# Patient Record
Sex: Male | Born: 1970 | Race: White | Hispanic: No | Marital: Married | Smoking: Current every day smoker
Health system: Southern US, Community
[De-identification: ages and names within clinical notes are randomized; demographics above are authoritative.]

## PROBLEM LIST (undated history)

## (undated) DIAGNOSIS — J45909 Unspecified asthma, uncomplicated: Secondary | ICD-10-CM

## (undated) DIAGNOSIS — I2510 Atherosclerotic heart disease of native coronary artery without angina pectoris: Secondary | ICD-10-CM

## (undated) DIAGNOSIS — E785 Hyperlipidemia, unspecified: Secondary | ICD-10-CM

## (undated) DIAGNOSIS — K219 Gastro-esophageal reflux disease without esophagitis: Secondary | ICD-10-CM

## (undated) DIAGNOSIS — I499 Cardiac arrhythmia, unspecified: Secondary | ICD-10-CM

## (undated) DIAGNOSIS — I10 Essential (primary) hypertension: Secondary | ICD-10-CM

## (undated) HISTORY — DX: Gastro-esophageal reflux disease without esophagitis: K21.9

## (undated) HISTORY — DX: Hyperlipidemia, unspecified: E78.5

## (undated) HISTORY — DX: Unspecified asthma, uncomplicated: J45.909

## (undated) HISTORY — PX: OTHER SURGICAL HISTORY: SHX169

## (undated) HISTORY — DX: Essential (primary) hypertension: I10

---

## 1997-12-21 ENCOUNTER — Emergency Department (HOSPITAL_COMMUNITY): Admission: EM | Admit: 1997-12-21 | Discharge: 1997-12-21 | Payer: Self-pay | Admitting: *Deleted

## 1997-12-21 ENCOUNTER — Encounter: Payer: Self-pay | Admitting: Emergency Medicine

## 1998-01-08 ENCOUNTER — Ambulatory Visit (HOSPITAL_COMMUNITY): Admission: RE | Admit: 1998-01-08 | Discharge: 1998-01-08 | Payer: Self-pay | Admitting: *Deleted

## 2004-04-13 ENCOUNTER — Emergency Department (HOSPITAL_COMMUNITY): Admission: EM | Admit: 2004-04-13 | Discharge: 2004-04-13 | Payer: Self-pay | Admitting: Emergency Medicine

## 2005-05-23 ENCOUNTER — Emergency Department (HOSPITAL_COMMUNITY): Admission: EM | Admit: 2005-05-23 | Discharge: 2005-05-23 | Payer: Self-pay | Admitting: Emergency Medicine

## 2006-06-12 ENCOUNTER — Emergency Department (HOSPITAL_COMMUNITY): Admission: EM | Admit: 2006-06-12 | Discharge: 2006-06-12 | Payer: Self-pay | Admitting: Emergency Medicine

## 2007-07-06 ENCOUNTER — Ambulatory Visit: Payer: Self-pay | Admitting: Neurological Surgery

## 2007-09-28 ENCOUNTER — Encounter
Admission: RE | Admit: 2007-09-28 | Discharge: 2007-09-28 | Payer: Self-pay | Admitting: Physical Medicine and Rehabilitation

## 2007-09-30 ENCOUNTER — Encounter
Admission: RE | Admit: 2007-09-30 | Discharge: 2007-09-30 | Payer: Self-pay | Admitting: Physical Medicine and Rehabilitation

## 2007-11-23 ENCOUNTER — Encounter: Admission: RE | Admit: 2007-11-23 | Discharge: 2007-11-23 | Payer: Self-pay | Admitting: Orthopaedic Surgery

## 2007-11-29 ENCOUNTER — Ambulatory Visit (HOSPITAL_BASED_OUTPATIENT_CLINIC_OR_DEPARTMENT_OTHER): Admission: RE | Admit: 2007-11-29 | Discharge: 2007-11-29 | Payer: Self-pay | Admitting: Orthopaedic Surgery

## 2007-12-15 ENCOUNTER — Encounter
Admission: RE | Admit: 2007-12-15 | Discharge: 2007-12-15 | Payer: Self-pay | Admitting: Physical Medicine and Rehabilitation

## 2010-08-19 NOTE — Op Note (Signed)
Jesse Nelson, Jesse Nelson              ACCOUNT NO.:  [id number]   MEDICAL RECORD NO.:  [id number]          PATIENT TYPE:  AMB   LOCATION:  DSC                          FACILITY:  [facility]   PHYSICIAN:  Lubertha Basque. Dalldorf, M.D.DATE OF BIRTH:  [birth year]   DATE OF PROCEDURE:  11/29/2007  DATE OF DISCHARGE:                               OPERATIVE REPORT   PREOPERATIVE DIAGNOSES:  1. Left shoulder impingement.  2. Left shoulder acromioclavicular pain.   POSTOPERATIVE DIAGNOSES:  1. Left shoulder impingement.  2. Left shoulder acromioclavicular pain.   PROCEDURES:  1. Left shoulder arthroscopic acromioplasty.  2. Left shoulder arthroscopic debridement.  3. Left shoulder open AC resection.   ANESTHESIA:  General and block.   ATTENDING SURGEON:  Lubertha Basque. Jerl Santos, MD   ASSISTANT:  Lindwood Qua, PA   INDICATIONS FOR PROCEDURE:  The patient is a 40 year old male with many  months of left shoulder pain.  This persisted despite oral anti-  inflammatories and rest and an injection.  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  He is offered an arthroscopy with continued pain which  limits his ability to rest and use his arm.  He has also been offered an  open [facility] resection as this looked like too much to remove via the scope.  Informed operative consent was obtained after discussion of possible  complications of reaction to anesthesia and infection.   SUMMARY/FINDINGS/PROCEDURE:  Under general anesthesia and a block, the  left shoulder procedure was performed.  Glenohumeral joint showed no  degenerative changes and the biceps tendon looked normal.  Rotator cuff  appeared benign from below.  In the subacromial space, he had some  irritation of his rotator cuff related to a large subacromial spur.  Acromioplasty was done with a bur followed by debridement of partial-  thickness rotator cuff tearing.  We then made an  incision on superior  aspect of the shoulder and excised the distal clavicular nonunion which  consisted of about half-an-inch or three-quarters of an inch of bone.  I  used fluoroscopy during this portion of the case and read these views  myself.  Bryna Colander assisted throughout and was invaluable to the  completion of the case in that he helped position and retract while I  performed the procedure.   DESCRIPTION OF PROCEDURE:  The patient was brought to the operating  suite where general anesthetic was applied without difficulty.  He was  also given a block in the preanesthesia area.  He was positioned in a  beach-chair position and prepped and draped in normal sterile fashion.  After administration of IV Kefzol, an arthroscopy of the left shoulder  was performed through total of 2 portals.  Findings were as noted above  and procedure consisted of acromioplasty done with a bur in the lateral  position followed by transfer of bur to posterior position.  I then  performed the debridement of bursal aspect of the cuff with a shaver.  No full-thickness or significant partial-thickness tear requiring repair  was  seen.  We then made a small incision over the [facility] joint with  dissection down to soft tissues.  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  I removed  this 3/4-inch segment of bone sharply with a knife.  I then used a saw  to freshen the end.  I used fluoroscopy to confirm adequate resection of  distal clavicle.  The wound was irrigated followed by reapproximation of  the soft tissues over the distal clavicle with 0 Vicryl suture.  We then  reapproximated subcutaneous tissues with 2-0 undyed Vicryl and skin was  closed with nylon.  We injected some Marcaine with epinephrine to the  shoulder and at the incision site.  Adaptic was applied to all incisions  and portals followed by dry gauze and tape.  Estimated blood loss and   intraoperative fluids can be obtained from anesthesia records.   DISPOSITION:  The patient was extubated in the operating room and taken  to recovery room in stable condition.  He is to go home the same day and  follow up in the office next week.  I will contact him by phone tonight.      Lubertha Basque Jerl Santos, M.D.  Electronically Signed     PGD/MEDQ  D:  11/29/2007  T:  11/30/2007  Job:  [id number]

## 2016-10-06 ENCOUNTER — Other Ambulatory Visit: Payer: Self-pay | Admitting: Orthopaedic Surgery

## 2016-10-06 DIAGNOSIS — M5412 Radiculopathy, cervical region: Secondary | ICD-10-CM

## 2016-10-20 ENCOUNTER — Ambulatory Visit
Admission: RE | Admit: 2016-10-20 | Discharge: 2016-10-20 | Disposition: A | Discharge status: Home / Self Care | Payer: Medicare Other | Source: Ambulatory Visit | Attending: Orthopaedic Surgery | Admitting: Orthopaedic Surgery

## 2016-10-20 DIAGNOSIS — M5412 Radiculopathy, cervical region: Secondary | ICD-10-CM

## 2016-10-20 MED ORDER — DIAZEPAM 5 MG PO TABS
5.0000 mg | ORAL_TABLET | Freq: Once | ORAL | Status: AC
  Administered 2016-10-20: 5 mg via ORAL

## 2016-10-20 MED ORDER — ONDANSETRON HCL 4 MG/2ML IJ SOLN
4.0000 mg | Freq: Four times a day (QID) | INTRAMUSCULAR | Status: DC | PRN
Start: 2016-10-20 — End: 2016-10-21

## 2016-10-20 MED ORDER — IOPAMIDOL (ISOVUE-M 300) INJECTION 61%
10.0000 mL | Freq: Once | INTRAMUSCULAR | Status: AC | PRN
  Administered 2016-10-20: 10 mL via INTRATHECAL

## 2016-10-20 NOTE — Discharge Instructions (Signed)

## 2017-07-20 IMAGING — CT CT CERVICAL SPINE W/ CM
2 series · 10 of 14 positions shown, 12 images · non-contrast
Comparison: Cervical spine MRI 07/06/2007

CLINICAL DATA: Cervical radiculopathy. Neck and left upper
extremity pain.
TECHNIQUE: Contiguous axial images were obtained through the Cervical spine
after the intrathecal infusion of infusion. Coronal and sagittal
reconstructions were obtained of the axial image sets.

[Series 2: cspine soft · axial · 0.23mm/px · z∈[-200,-70]mm · 5 of 99 slices shown]
[im 17/99  soft-tissue]
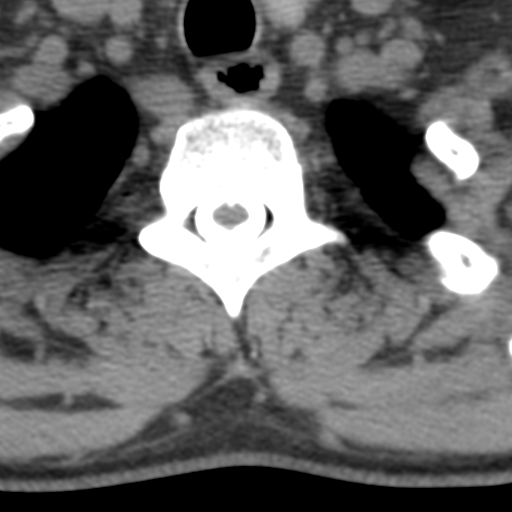
[im 33/99  soft-tissue]
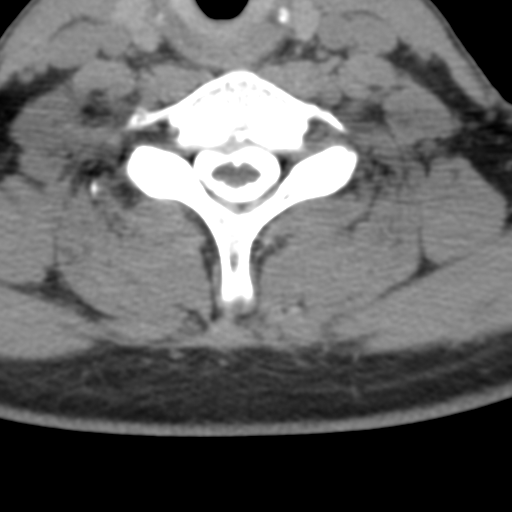
[im 50/99  soft-tissue]
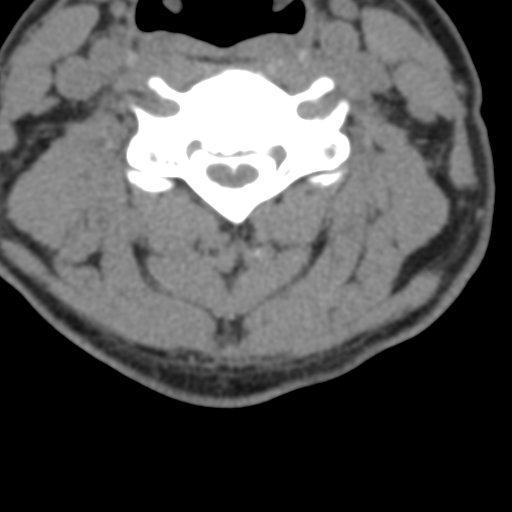
[im 66/99  soft-tissue]
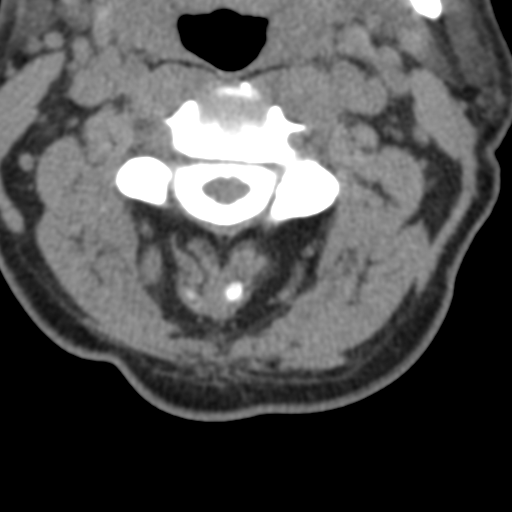
[im 82/99  soft-tissue]
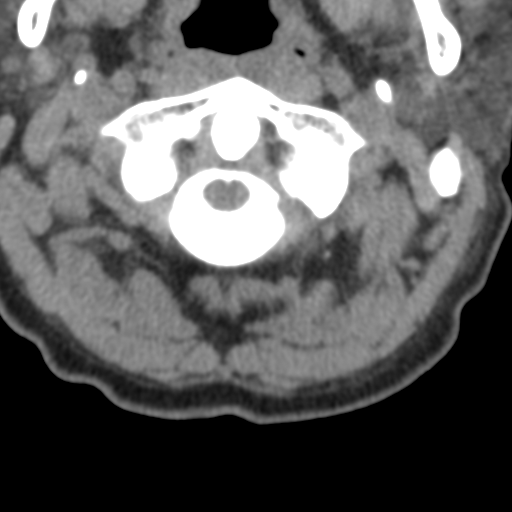

[Series 8: angled axial · axial · 0.23mm/px · z∈[-201,-73]mm · 5 of 98 slices shown, 7 images]
[im 17/98  soft-tissue]
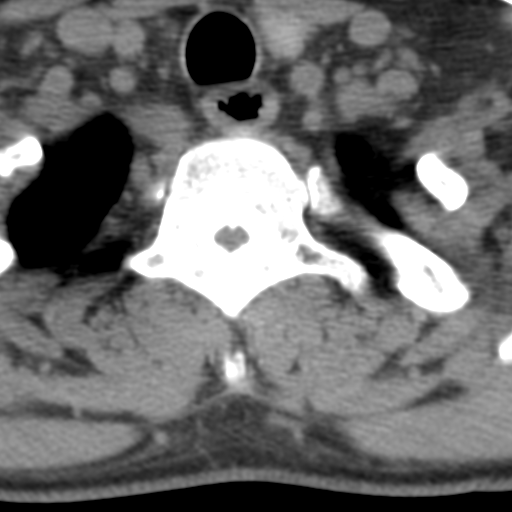
[im 17/98  bone]
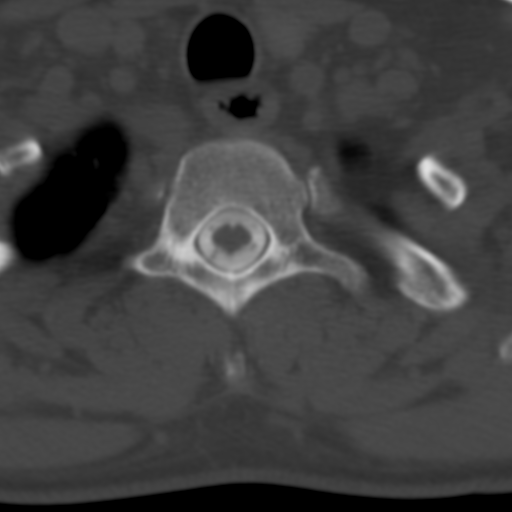
[im 33/98  bone]
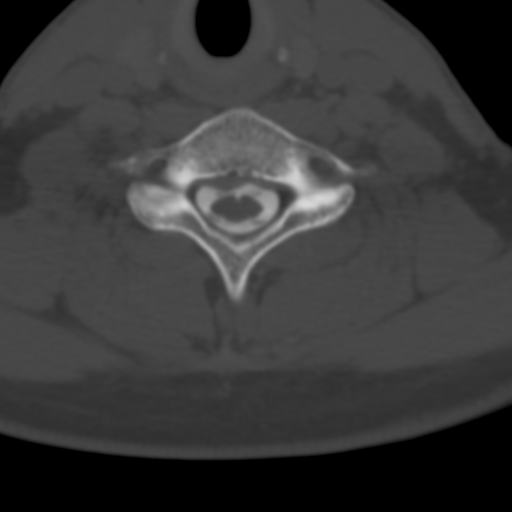
[im 49/98  bone]
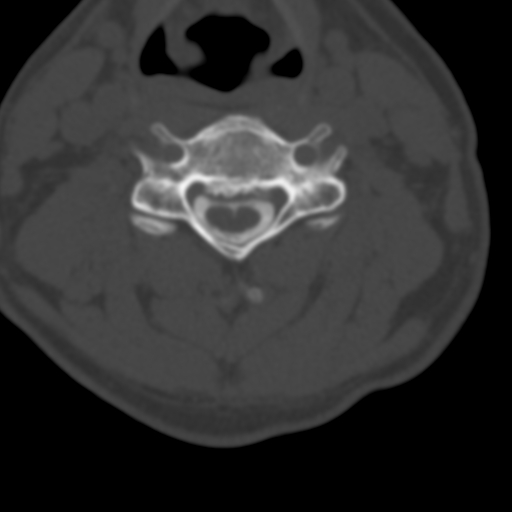
[im 65/98  bone]
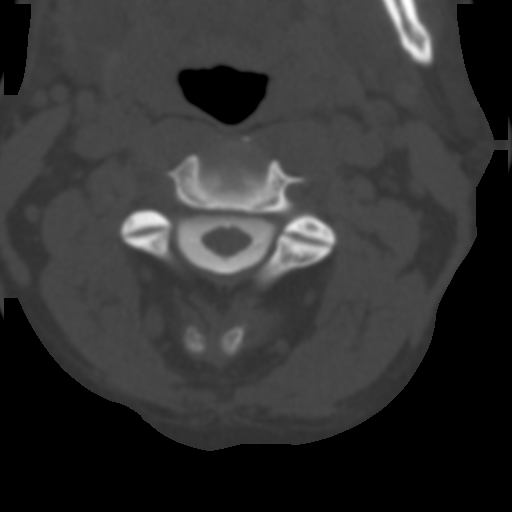
[im 81/98  soft-tissue]
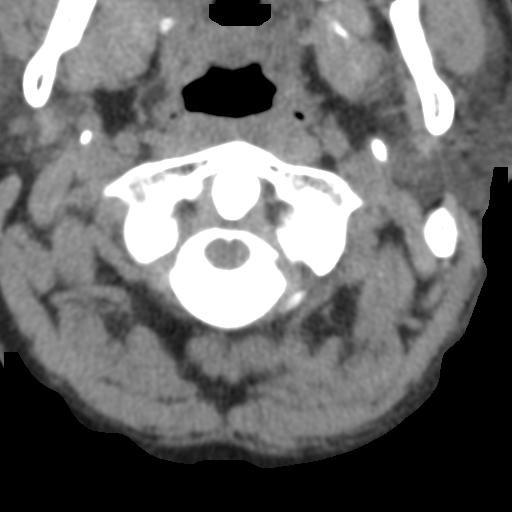
[im 81/98  bone]
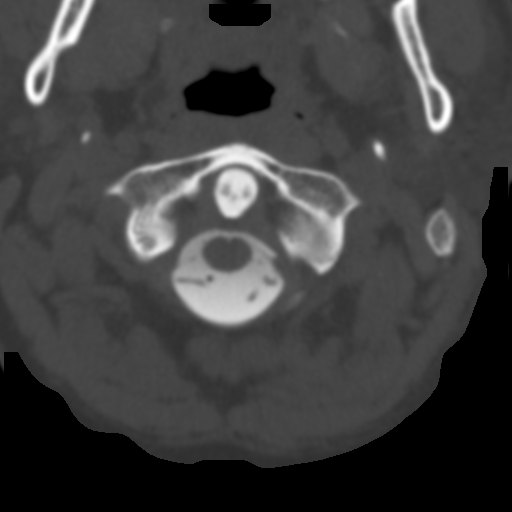

[10 of 14 positions shown; findings below may reference images not displayed]

FLUOROSCOPY TIME:  Radiation Exposure Index (as provided by the
fluoroscopic device): 40.01 microGray*m^2

Fluoroscopy Time (in minutes and seconds):  19 seconds

PROCEDURE:
LUMBAR PUNCTURE FOR CERVICAL MYELOGRAM

After thorough discussion of risks and benefits of the procedure
including bleeding, infection, injury to nerves, blood vessels,
adjacent structures as well as headache and CSF leak, written and
oral informed consent was obtained. Consent was obtained by Dr.
Mevalleb Serbellon. We discussed the high likelihood of obtaining a
diagnostic study.

Patient was positioned prone on the fluoroscopy table. Local
anesthesia was provided with 1% lidocaine without epinephrine after
prepped and draped in the usual sterile fashion. Puncture was
performed at L3-4 using a 3 1/2 inch 22-gauge spinal needle via a
right interlaminar approach. Using a single pass through the dura,
the needle was placed within the thecal sac, with return of clear
CSF. 10 mL of Isovue-M 300 was injected into the thecal sac, with
normal opacification of the nerve roots and cauda equina consistent
with free flow within the subarachnoid space. The patient was then
moved to the trendelenburg position and contrast flowed into the
Cervical spine region.

I personally performed the lumbar puncture and administered the
intrathecal contrast. I also personally supervised acquisition of
the myelogram images.
FINDINGS: CERVICAL MYELOGRAM FINDINGS:

There is grade 1 retrolisthesis of C3 on C4 and C4 on C5. Disc
degeneration is present from C3-4 to C6-7 with effacement of the
nerve root sleeves bilaterally at these levels, most notable on the
left. Ventral extradural defects are present at each of these levels
though the cervical spinal canal appears relatively capacious in
general and there is only likely at most mild spinal stenosis.

CT CERVICAL MYELOGRAM FINDINGS:

Vertebral alignment is unchanged from the prior MRI, with slight
retrolisthesis again seen of C3 on C4 and C4 on C5 and with
straightening of the normal lordosis in the upper cervical spine. No
fracture or destructive osseous process is identified. Cervical
spinal cord morphology is normal. The paraspinal soft tissues are
unremarkable.

C2-3:  Negative.

C3-4: Mild disc space narrowing. Disc bulging and right greater than
left uncovertebral spurring result in moderate right and
mild-to-moderate left neural foraminal stenosis, not significantly
changed. There is partial effacement of the ventral thecal sac
without significant overall spinal stenosis or spinal cord mass
effect.

C4-5: Moderate disc space narrowing. Broad-based posterior disc
osteophyte complex results in mild spinal stenosis and
mild-to-moderate right and moderate to severe left neural foraminal
stenosis, not significantly changed. Potential left C5 nerve root
impingement.

C5-6: Disc bulging, uncovertebral spurring, and a left paracentral/
posterolateral disc osteophyte complex result in mild right and
moderate to severe left neural foraminal stenosis and mild spinal
stenosis, not significantly changed. Potential left C6 nerve root
impingement.

C6-7: Slight disc space narrowing. Increased disc bulging results in
new mild spinal stenosis and mild left neural foraminal stenosis.
There is a new right paracentral disc extrusion extending along the
length of the C7 vertebral body, favored to be arising from the C6-7
disc rather than C7-T1 and contributing to the mild spinal stenosis.

C7-T1: Right paracentral disc herniation as described above. No
significant spinal or neural foraminal stenosis.
IMPRESSION: 1. Mild spinal stenosis and moderate to severe left neural foraminal
stenosis at C4-5 and C5-6, not significantly changed. Potential left
C5 and C6 nerve root impingement.
2. Progressive disc degeneration at C6-7 with mild spinal and mild
left neural foraminal stenosis.
3. Unchanged right greater than left foraminal stenosis at C3-4.

## 2020-03-14 ENCOUNTER — Ambulatory Visit (INDEPENDENT_AMBULATORY_CARE_PROVIDER_SITE_OTHER): Payer: Medicare Other | Admitting: Urology

## 2020-03-14 ENCOUNTER — Other Ambulatory Visit: Payer: Self-pay

## 2020-03-14 ENCOUNTER — Encounter: Payer: Self-pay | Admitting: Urology

## 2020-03-14 VITALS — BP 134/72 | HR 96 | Ht 68.0 in | Wt 150.0 lb

## 2020-03-14 DIAGNOSIS — N489 Disorder of penis, unspecified: Secondary | ICD-10-CM

## 2020-03-14 NOTE — Progress Notes (Signed)
   03/14/2020 10:18 AM   Jesse Nelson 1970/07/20 [id number]  Referring provider: Sherron Monday, MD [address] [place],  [place] [address]  Chief Complaint  Patient presents with  . Other    HPI: Jesse Nelson is a 49 y.o. male seen at the request of Dr.Tejan-Sie for evaluation of a penile wart.   Present 3-4 years  States initially treated with a topical application without success  No bothersome LUTS   PMH: Past Medical History:  Diagnosis Date  . Asthma   . GERD (gastroesophageal reflux disease)   . Hyperlipidemia   . Hypertension     Surgical History: History reviewed. No pertinent surgical history.  Home Medications:  Allergies as of 03/14/2020   No Known Allergies     Medication List       Accurate as of March 14, 2020 10:18 AM. If you have any questions, ask your nurse or doctor.        albuterol 108 (90 Base) MCG/ACT inhaler Commonly known as: VENTOLIN HFA Inhale into the lungs every 6 (six) hours as needed for wheezing or shortness of breath.   fluticasone-salmeterol 115-21 MCG/ACT inhaler Commonly known as: ADVAIR HFA Inhale 2 puffs into the lungs 2 (two) times daily.   lisinopril 10 MG tablet Commonly known as: ZESTRIL Take 10 mg by mouth daily.   omeprazole 40 MG capsule Commonly known as: PRILOSEC Take 40 mg by mouth daily.   oxyCODONE-acetaminophen 10-325 MG tablet Commonly known as: PERCOCET Take 1 tablet by mouth every 4 (four) hours as needed for pain.       Allergies: No Known Allergies  Family History: History reviewed. No pertinent family history.  Social History:  reports that he has been smoking cigarettes. He has a 26.00 pack-year smoking history. He has never used smokeless tobacco. No history on file for alcohol use and drug use.   Physical Exam: BP 134/72   Pulse 96   Ht 5\' 8"  (1.727 m)   Wt 150 lb (68 kg)   BMI 22.81 kg/m   Constitutional:  Alert and oriented, No acute distress. HEENT: [place] AT,  moist mucus membranes.  Trachea midline, no masses. Cardiovascular: No clubbing, cyanosis, or edema. Respiratory: Normal respiratory effort, no increased work of breathing. GU: Phallus circumcised; on ventral shaft distally is a 5 mm lesion on a small stalk Skin: No rashes, bruises or suspicious lesions. Neurologic: Grossly intact, no focal deficits, moving all 4 extremities. Psychiatric: Normal mood and affect.   Assessment & Plan:    1.  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

## 2020-03-22 ENCOUNTER — Other Ambulatory Visit: Payer: Self-pay | Admitting: Urology

## 2020-03-22 ENCOUNTER — Ambulatory Visit (INDEPENDENT_AMBULATORY_CARE_PROVIDER_SITE_OTHER): Payer: Medicare Other | Admitting: Urology

## 2020-03-22 ENCOUNTER — Encounter: Payer: Self-pay | Admitting: Urology

## 2020-03-22 ENCOUNTER — Other Ambulatory Visit: Payer: Self-pay

## 2020-03-22 VITALS — BP 122/73 | HR 91 | Ht 68.0 in | Wt 150.0 lb

## 2020-03-22 DIAGNOSIS — N489 Disorder of penis, unspecified: Secondary | ICD-10-CM | POA: Diagnosis not present

## 2020-03-22 NOTE — Progress Notes (Signed)
[id number]  

## 2020-03-23 ENCOUNTER — Encounter: Payer: Self-pay | Admitting: Urology

## 2020-03-23 NOTE — Progress Notes (Signed)
Procedure note  Diagnosis: Penile skin lesion Procedure: Full-thickness excision penile skin lesion Anesthetic: 1% plain Xylocaine Indications: 49 y.o. male with a penile skin lesion who desires excision Complications: None  Description: External genitalia were prepped and draped. Timeout was performed. The skin around the pedunculated 0.7 cm lesion located on the ventral penile shaft distally was anesthetized with 1% plain Xylocaine.  The lesion had a small stalk and was excised with a small ellipse. Hemostasis was obtained with cautery. The skin defect was closed with interrupted 3-0 chromic suture x2. The site was dressed with a loose gauze wrap.  Plan:  Lesion sent for pathologic analysis  Abstain from intercourse x5-7 days; use condom if sutures have not dissolved  May shower in 24 hours, no bath, hot tub or swimming for 7 days  Call for redness, drainage, swelling or fever >101 degrees

## 2020-03-26 LAB — SURGICAL PATHOLOGY

## 2020-03-27 ENCOUNTER — Telehealth: Payer: Self-pay | Admitting: *Deleted

## 2020-03-27 NOTE — Telephone Encounter (Signed)
Notified patient as instructed, patient pleased °

## 2020-03-27 NOTE — Telephone Encounter (Signed)
-----   Message from Riki Altes, MD sent at 03/27/2020  7:45 AM EST ----- Pathology report on excised skin lesion was a venereal wart.  No further treatment is needed

## 2022-05-19 ENCOUNTER — Other Ambulatory Visit: Payer: Medicare Other

## 2022-05-19 ENCOUNTER — Other Ambulatory Visit: Payer: Self-pay | Admitting: Internal Medicine

## 2022-05-19 ENCOUNTER — Ambulatory Visit: Payer: Medicare Other | Admitting: Internal Medicine

## 2022-05-20 LAB — COMPREHENSIVE METABOLIC PANEL
ALT: 13 IU/L (ref 0–44)
AST: 31 IU/L (ref 0–40)
Albumin/Globulin Ratio: 1.8 (ref 1.2–2.2)
Albumin: 4.5 g/dL (ref 3.8–4.9)
Alkaline Phosphatase: 69 IU/L (ref 44–121)
BUN/Creatinine Ratio: 12 (ref 9–20)
BUN: 14 mg/dL (ref 6–24)
Bilirubin Total: 0.3 mg/dL (ref 0.0–1.2)
CO2: 24 mmol/L (ref 20–29)
Calcium: 9.3 mg/dL (ref 8.7–10.2)
Chloride: 103 mmol/L (ref 96–106)
Creatinine, Ser: 1.15 mg/dL (ref 0.76–1.27)
Globulin, Total: 2.5 g/dL (ref 1.5–4.5)
Glucose: 104 mg/dL — ABNORMAL HIGH (ref 70–99)
Potassium: 4.3 mmol/L (ref 3.5–5.2)
Sodium: 142 mmol/L (ref 134–144)
Total Protein: 7 g/dL (ref 6.0–8.5)
eGFR: 77 mL/min/{1.73_m2} (ref >59)

## 2022-05-20 LAB — LIPID PANEL W/O CHOL/HDL RATIO
Cholesterol, Total: 157 mg/dL (ref 100–199)
HDL: 44 mg/dL (ref >39)
LDL Chol Calc (NIH): 65 mg/dL (ref 0–99)
Triglycerides: 301 mg/dL — ABNORMAL HIGH (ref 0–149)
VLDL Cholesterol Cal: 48 mg/dL — ABNORMAL HIGH (ref 5–40)

## 2022-05-20 LAB — HGB A1C W/O EAG: Hgb A1c MFr Bld: 5.9 % — ABNORMAL HIGH (ref 4.8–5.6)

## 2022-05-22 ENCOUNTER — Encounter: Payer: Self-pay | Admitting: Internal Medicine

## 2022-05-22 ENCOUNTER — Ambulatory Visit (INDEPENDENT_AMBULATORY_CARE_PROVIDER_SITE_OTHER): Payer: Medicare Other | Admitting: Internal Medicine

## 2022-05-22 VITALS — BP 122/80 | HR 98 | Ht 68.0 in | Wt 151.6 lb

## 2022-05-22 DIAGNOSIS — E782 Mixed hyperlipidemia: Secondary | ICD-10-CM | POA: Diagnosis not present

## 2022-05-22 DIAGNOSIS — K219 Gastro-esophageal reflux disease without esophagitis: Secondary | ICD-10-CM | POA: Diagnosis not present

## 2022-05-22 DIAGNOSIS — I10 Essential (primary) hypertension: Secondary | ICD-10-CM | POA: Diagnosis not present

## 2022-05-22 DIAGNOSIS — R7303 Prediabetes: Secondary | ICD-10-CM | POA: Diagnosis not present

## 2022-05-22 NOTE — Progress Notes (Signed)
Established Patient Office Visit  Subjective:  Patient ID: Jesse Nelson, male    DOB: [birth year]  Age: 52 y.o. MRN: [id number]  Chief Complaint  Patient presents with   Follow-up    3 month follow up    No new complaints, here for lab review and medication refills. Labs reviewed and notable for improvement in prediabetes, well controlled lipids but deterioration in triglycerides.    Past Medical History:  Diagnosis Date   Asthma    GERD (gastroesophageal reflux disease)    Hyperlipidemia    Hypertension     Social History   Socioeconomic History   Marital status: Married    Spouse name: Not on file   Number of children: Not on file   Years of education: Not on file   Highest education level: Not on file  Occupational History   Not on file  Tobacco Use   Smoking status: Every Day    Packs/day: 1.00    Years: 26.00    Total pack years: 26.00    Types: Cigarettes   Smokeless tobacco: Never  Substance and Sexual Activity   Alcohol use: Not on file   Drug use: Not on file   Sexual activity: Not on file  Other Topics Concern   Not on file  Social History Narrative   Not on file   Social Determinants of Health   Financial Resource Strain: Not on file  Food Insecurity: Not on file  Transportation Needs: Not on file  Physical Activity: Not on file  Stress: Not on file  Social Connections: Not on file  Intimate Partner Violence: Not on file    No family history on file.  No Known Allergies  Review of Systems  Constitutional: Negative.   HENT: Negative.    Eyes: Negative.   Respiratory: Negative.    Cardiovascular: Negative.   Gastrointestinal: Negative.   Genitourinary: Negative.   Musculoskeletal:        Chronic pain  Skin: Negative.   Neurological: Negative.   Endo/Heme/Allergies: Negative.   Psychiatric/Behavioral: Negative.         Objective:   BP 122/80   Pulse 98   Ht 5' 8"$  (1.727 m)   Wt 151 lb 9.6 oz (68.8 kg)   SpO2 97%    BMI 23.05 kg/m   Vitals:   05/22/22 1147  BP: 122/80  Pulse: 98  Height: 5' 8"$  (1.727 m)  Weight: 151 lb 9.6 oz (68.8 kg)  SpO2: 97%  BMI (Calculated): 23.06    Physical Exam Vitals reviewed.  Constitutional:      Appearance: Normal appearance.  HENT:     Head: Normocephalic.     Left Ear: There is no impacted cerumen.     Nose: Nose normal.     Mouth/Throat:     Mouth: Mucous membranes are moist.     Pharynx: No posterior oropharyngeal erythema.  Eyes:     Extraocular Movements: Extraocular movements intact.     Pupils: Pupils are equal, round, and reactive to light.  Cardiovascular:     Rate and Rhythm: Regular rhythm.     Chest Wall: PMI is not displaced.     Pulses: Normal pulses.     Heart sounds: Normal heart sounds. No murmur heard. Pulmonary:     Effort: Pulmonary effort is normal.     Breath sounds: Normal air entry. No rhonchi or rales.  Abdominal:     General: Abdomen is flat. Bowel sounds are normal. There is  no distension.     Palpations: Abdomen is soft. There is no hepatomegaly, splenomegaly or mass.     Tenderness: There is no abdominal tenderness.  Musculoskeletal:        General: Normal range of motion.     Cervical back: Normal range of motion and neck supple.     Right lower leg: No edema.     Left lower leg: No edema.  Skin:    General: Skin is warm and dry.  Neurological:     General: No focal deficit present.     Mental Status: He is alert and oriented to person, place, and time.     Cranial Nerves: No cranial nerve deficit.     Motor: No weakness.  Psychiatric:        Mood and Affect: Mood normal.        Behavior: Behavior normal.      No results found for any visits on 05/22/22.  Recent Results (from the past 2160 hour(Angla Delahunt))  Comprehensive metabolic panel     Status: Abnormal   Collection Time: 05/19/22  9:48 AM  Result Value Ref Range   Glucose 104 (H) 70 - 99 mg/dL   BUN 14 6 - 24 mg/dL   Creatinine, Ser 1.15 0.76 - 1.27 mg/dL    eGFR 77 >59 mL/min/1.73   BUN/Creatinine Ratio 12 9 - 20   Sodium 142 134 - 144 mmol/L   Potassium 4.3 3.5 - 5.2 mmol/L   Chloride 103 96 - 106 mmol/L   CO2 24 20 - 29 mmol/L   Calcium 9.3 8.7 - 10.2 mg/dL   Total Protein 7.0 6.0 - 8.5 g/dL   Albumin 4.5 3.8 - 4.9 g/dL   Globulin, Total 2.5 1.5 - 4.5 g/dL   Albumin/Globulin Ratio 1.8 1.2 - 2.2   Bilirubin Total 0.3 0.0 - 1.2 mg/dL   Alkaline Phosphatase 69 44 - 121 IU/L   AST 31 0 - 40 IU/L   ALT 13 0 - 44 IU/L  Lipid Panel w/o Chol/HDL Ratio     Status: Abnormal   Collection Time: 05/19/22  9:48 AM  Result Value Ref Range   Cholesterol, Total 157 100 - 199 mg/dL   Triglycerides 301 (H) 0 - 149 mg/dL   HDL 44 >39 mg/dL   VLDL Cholesterol Cal 48 (H) 5 - 40 mg/dL   LDL Chol Calc (NIH) 65 0 - 99 mg/dL  Hgb A1c w/o eAG     Status: Abnormal   Collection Time: 05/19/22  9:48 AM  Result Value Ref Range   Hgb A1c MFr Bld 5.9 (H) 4.8 - 5.6 %    Comment:          Prediabetes: 5.7 - 6.4          Diabetes: >6.4          Glycemic control for adults with diabetes: <7.0       Assessment & Plan:   Problem List Items Addressed This Visit   None 1. Mixed hyperlipidemia - Comprehensive metabolic panel; Future - Lipid panel; Future  2. Primary hypertension  3. Prediabetes  4. GERD without esophagitis    No follow-ups on file.   Total time spent: 20 minutes  Volanda Napoleon, MD  05/22/2022

## 2022-06-15 ENCOUNTER — Other Ambulatory Visit: Payer: Self-pay | Admitting: Internal Medicine

## 2022-07-08 ENCOUNTER — Other Ambulatory Visit: Payer: Self-pay | Admitting: Internal Medicine

## 2022-07-23 ENCOUNTER — Ambulatory Visit (INDEPENDENT_AMBULATORY_CARE_PROVIDER_SITE_OTHER): Payer: Medicare Other | Admitting: Cardiovascular Disease

## 2022-07-23 ENCOUNTER — Encounter: Payer: Self-pay | Admitting: Cardiovascular Disease

## 2022-07-23 VITALS — BP 138/80 | HR 116 | Ht 68.0 in | Wt 151.0 lb

## 2022-07-23 DIAGNOSIS — R0602 Shortness of breath: Secondary | ICD-10-CM | POA: Diagnosis not present

## 2022-07-23 DIAGNOSIS — E782 Mixed hyperlipidemia: Secondary | ICD-10-CM | POA: Diagnosis not present

## 2022-07-23 DIAGNOSIS — I340 Nonrheumatic mitral (valve) insufficiency: Secondary | ICD-10-CM | POA: Diagnosis not present

## 2022-07-23 MED ORDER — NITROGLYCERIN 0.4 MG SL SUBL: 0.4000 mg | SUBLINGUAL_TABLET | SUBLINGUAL | 3 refills | Status: AC | PRN

## 2022-07-23 MED ORDER — LISINOPRIL-HYDROCHLOROTHIAZIDE 20-12.5 MG PO TABS: 2.0000 | ORAL_TABLET | Freq: Every day | ORAL | 0 refills | Status: DC

## 2022-07-23 MED ORDER — AMLODIPINE BESYLATE 5 MG PO TABS: 5.0000 mg | ORAL_TABLET | Freq: Every morning | ORAL | 2 refills | Status: DC

## 2022-07-23 MED ORDER — METOPROLOL SUCCINATE ER 100 MG PO TB24: 100.0000 mg | ORAL_TABLET | Freq: Every day | ORAL | 2 refills | Status: DC

## 2022-07-23 NOTE — Progress Notes (Signed)
Cardiology Office Note   Date:  07/23/2022   ID:  Jesse Nelson, DOB 12-09-1970, MRN [id number]  PCP:  Sherron Monday, MD  Cardiologist:  Adrian Blackwater, MD      History of Present Illness: Jesse Nelson is a 52 y.o. male who presents for  Chief Complaint  Patient presents with   Follow-up    3 month follow up    Occasional SOB      Past Medical History:  Diagnosis Date   Asthma    GERD (gastroesophageal reflux disease)    Hyperlipidemia    Hypertension      History reviewed. No pertinent surgical history.   Current Outpatient Medications  Medication Sig Dispense Refill   albuterol (VENTOLIN HFA) 108 (90 Base) MCG/ACT inhaler INHALE 1 PUFF EVERY 6 HOURS AS NEEDED 18 g 5   fluticasone (FLONASE) 50 MCG/ACT nasal spray Place 2 sprays into both nostrils daily.     montelukast (SINGULAIR) 10 MG tablet Take 10 mg by mouth every morning.     nitroGLYCERIN (NITROSTAT) 0.4 MG SL tablet Place 1 tablet (0.4 mg total) under the tongue every 5 (five) minutes as needed for chest pain. 100 tablet 3   omeprazole (PRILOSEC) 40 MG capsule TAKE 1 CAPSULE BY MOUTH EVERY DAY 90 capsule 0   oxyCODONE-acetaminophen (PERCOCET) 10-325 MG tablet Take 1 tablet by mouth every 4 (four) hours as needed for pain.     VASCEPA 1 g capsule Take 2 g by mouth 2 (two) times daily.     amLODipine (NORVASC) 5 MG tablet Take 1 tablet (5 mg total) by mouth every morning. 90 tablet 2   fluticasone-salmeterol (ADVAIR HFA) 115-21 MCG/ACT inhaler Inhale 2 puffs into the lungs 2 (two) times daily. (Patient not taking: Reported on 05/22/2022)     lisinopril-hydrochlorothiazide (ZESTORETIC) 20-12.5 MG tablet Take 2 tablets by mouth daily. 180 tablet 0   metoprolol succinate (TOPROL-XL) 100 MG 24 hr tablet Take 1 tablet (100 mg total) by mouth daily. 30 tablet 2   No current facility-administered medications for this visit.    Allergies:   Patient has no known allergies.    Social History:   reports  that he has been smoking cigarettes. He has a 26.00 pack-year smoking history. He has never used smokeless tobacco. No history on file for alcohol use and drug use.   Family History:  family history is not on file.    ROS:     Review of Systems  Constitutional: Negative.   HENT: Negative.    Eyes: Negative.   Respiratory: Negative.    Gastrointestinal: Negative.   Genitourinary: Negative.   Musculoskeletal: Negative.   Skin: Negative.   Neurological: Negative.   Endo/Heme/Allergies: Negative.   Psychiatric/Behavioral: Negative.    All other systems reviewed and are negative.     All other systems are reviewed and negative.    PHYSICAL EXAM: VS:  BP 138/80   Pulse (!) 116   Ht  (1.727 m)   Wt 151 lb (68.5 kg)   SpO2 97%   BMI 22.96 kg/m  , BMI Body mass index is 22.96 kg/m. Last weight:  Wt Readings from Last 3 Encounters:  07/23/22 151 lb (68.5 kg)  05/22/22 151 lb 9.6 oz (68.8 kg)  03/22/20 150 lb (68 kg)     Physical Exam Vitals reviewed.  Constitutional:      Appearance: Normal appearance. He is normal weight.  HENT:     Head: Normocephalic.  Nose: Nose normal.     Mouth/Throat:     Mouth: Mucous membranes are moist.  Eyes:     Pupils: Pupils are equal, round, and reactive to light.  Cardiovascular:     Rate and Rhythm: Normal rate and regular rhythm.     Pulses: Normal pulses.     Heart sounds: Normal heart sounds.  Pulmonary:     Effort: Pulmonary effort is normal.  Abdominal:     General: Abdomen is flat. Bowel sounds are normal.  Musculoskeletal:        General: Normal range of motion.     Cervical back: Normal range of motion.  Skin:    General: Skin is warm.  Neurological:     General: No focal deficit present.     Mental Status: He is alert.  Psychiatric:        Mood and Affect: Mood normal.       EKG:   Recent Labs: 05/19/2022: ALT 13; BUN 14; Creatinine, Ser 1.15; Potassium 4.3; Sodium 142    Lipid Panel     Component Value Date/Time   CHOL 157 05/19/2022 0948   TRIG 301 (H) 05/19/2022 0948   HDL 44 05/19/2022 0948   LDLCALC 65 05/19/2022 0948      REASON FOR VISIT  Visit for: Echocardiogram/R07.9  Sex:   Male         wt=145    lbs.  BP=134/80  Height=68    inches.        TESTS  Imaging: Echocardiogram:  An echocardiogram in (2-d) mode was performed and in Doppler mode with color flow velocity mapping was performed. The aortic valve cusps are abnormal 1.8   cm, flow velocity 1.03   m/s, and systolic calculated mean flow gradient 2  mmHg. Mitral valve diastolic peak flow velocity E .859   m/s and E/A ratio 1.2. Aortic root diameter 3.3  cm. The LVOT internal diameter 3.1 cm and flow velocity was abnormal 1.25   m/s. LV systolic dimension 1.73   cm, diastolic 4.05   cm, posterior wall thickness 1.13  cm, fractional shortening 57.3%, and EF 87.8%. IVS thickness 0.801   cm. LA dimension 3.6 cm. Mitral Valve has Trace Regurgitation. Aortic Valve has Trace Regurgitation. Pulmonic Valve has Trace Regurgitation. Tricuspid Valve has Trace Regurgitation.     ASSESSMENT  Technically adequate study.  Normal chamber sizes.  Normal left ventricular systolic function.  Normal right ventricular systolic function.  Normal right ventricular diastolic function.  Normal left ventricular wall motion.  Normal right ventricular wall motion.  Trace pulmonary regurgitation.  Trace tricuspid regurgitation.  Normal pulmonary artery pressure.  Trace mitral regurgitation.  Trace aortic regurgitation.  No pericardial effusion.  No LVH.     THERAPY   Referring physician: Laurier Nancy  Sonographer: Adrian Blackwater.      Adrian Blackwater MD  Electronically signed by: Adrian Blackwater     Date: 09/15/2021 08:55 Other studies Reviewed: Additional studies/ records that were reviewed today include:  Review of the above records demonstrates:       No data to display            ASSESSMENT  AND PLAN:    ICD-10-CM   1. SOB (shortness of breath)  R06.02    getting better    2. Mixed hyperlipidemia  E78.2     3. Nonrheumatic mitral valve regurgitation  I34.0        Problem List Items Addressed This Visit  None Visit Diagnoses     SOB (shortness of breath)    -  Primary   getting better   Mixed hyperlipidemia       Relevant Medications   amLODipine (NORVASC) 5 MG tablet   metoprolol succinate (TOPROL-XL) 100 MG 24 hr tablet   lisinopril-hydrochlorothiazide (ZESTORETIC) 20-12.5 MG tablet   nitroGLYCERIN (NITROSTAT) 0.4 MG SL tablet   Nonrheumatic mitral valve regurgitation       Relevant Medications   amLODipine (NORVASC) 5 MG tablet   metoprolol succinate (TOPROL-XL) 100 MG 24 hr tablet   lisinopril-hydrochlorothiazide (ZESTORETIC) 20-12.5 MG tablet   nitroGLYCERIN (NITROSTAT) 0.4 MG SL tablet          Disposition:   Return in about 3 months (around 10/22/2022).    Total time spent: 30 minutes  Signed,  Adrian Blackwater, MD  07/23/2022 11:38 AM    [facility]

## 2022-07-26 ENCOUNTER — Other Ambulatory Visit: Payer: Self-pay | Admitting: Cardiovascular Disease

## 2022-07-26 ENCOUNTER — Other Ambulatory Visit: Payer: Self-pay | Admitting: Internal Medicine

## 2022-08-26 ENCOUNTER — Other Ambulatory Visit: Payer: Medicare Other

## 2022-08-26 ENCOUNTER — Other Ambulatory Visit: Payer: Self-pay

## 2022-08-26 DIAGNOSIS — E782 Mixed hyperlipidemia: Secondary | ICD-10-CM

## 2022-08-27 LAB — COMPREHENSIVE METABOLIC PANEL
ALT: 13 IU/L (ref 0–44)
AST: 31 IU/L (ref 0–40)
Albumin/Globulin Ratio: 1.9 (ref 1.2–2.2)
Albumin: 4.7 g/dL (ref 3.8–4.9)
Alkaline Phosphatase: 70 IU/L (ref 44–121)
BUN/Creatinine Ratio: 12 (ref 9–20)
BUN: 15 mg/dL (ref 6–24)
Bilirubin Total: 0.2 mg/dL (ref 0.0–1.2)
CO2: 22 mmol/L (ref 20–29)
Calcium: 9.8 mg/dL (ref 8.7–10.2)
Chloride: 99 mmol/L (ref 96–106)
Creatinine, Ser: 1.21 mg/dL (ref 0.76–1.27)
Globulin, Total: 2.5 g/dL (ref 1.5–4.5)
Glucose: 112 mg/dL — ABNORMAL HIGH (ref 70–99)
Potassium: 5.3 mmol/L — ABNORMAL HIGH (ref 3.5–5.2)
Sodium: 138 mmol/L (ref 134–144)
Total Protein: 7.2 g/dL (ref 6.0–8.5)
eGFR: 72 mL/min/{1.73_m2} (ref >59)

## 2022-08-27 LAB — HEMOGLOBIN A1C
Est. average glucose Bld gHb Est-mCnc: 120 mg/dL
Hgb A1c MFr Bld: 5.8 % — ABNORMAL HIGH (ref 4.8–5.6)

## 2022-08-27 LAB — LIPID PANEL
Chol/HDL Ratio: 3.3 ratio (ref 0.0–5.0)
Cholesterol, Total: 164 mg/dL (ref 100–199)
HDL: 50 mg/dL (ref >39)
LDL Chol Calc (NIH): 71 mg/dL (ref 0–99)
Triglycerides: 265 mg/dL — ABNORMAL HIGH (ref 0–149)
VLDL Cholesterol Cal: 43 mg/dL — ABNORMAL HIGH (ref 5–40)

## 2022-08-28 ENCOUNTER — Encounter: Payer: Self-pay | Admitting: Internal Medicine

## 2022-08-28 ENCOUNTER — Ambulatory Visit (INDEPENDENT_AMBULATORY_CARE_PROVIDER_SITE_OTHER): Payer: Medicare Other | Admitting: Internal Medicine

## 2022-08-28 VITALS — BP 138/72 | HR 104 | Ht 68.0 in | Wt 148.0 lb

## 2022-08-28 DIAGNOSIS — E782 Mixed hyperlipidemia: Secondary | ICD-10-CM

## 2022-08-28 DIAGNOSIS — R7303 Prediabetes: Secondary | ICD-10-CM | POA: Diagnosis not present

## 2022-08-28 DIAGNOSIS — E875 Hyperkalemia: Secondary | ICD-10-CM

## 2022-08-28 DIAGNOSIS — I10 Essential (primary) hypertension: Secondary | ICD-10-CM | POA: Diagnosis not present

## 2022-08-28 DIAGNOSIS — J4540 Moderate persistent asthma, uncomplicated: Secondary | ICD-10-CM

## 2022-08-28 MED ORDER — VASCEPA 1 G PO CAPS: 2.0000 g | ORAL_CAPSULE | Freq: Two times a day (BID) | ORAL | 1 refills | Status: DC

## 2022-08-28 MED ORDER — ALBUTEROL SULFATE HFA 108 (90 BASE) MCG/ACT IN AERS: INHALATION_SPRAY | RESPIRATORY_TRACT | 5 refills | Status: DC

## 2022-08-28 NOTE — Progress Notes (Signed)
Established Patient Office Visit  Subjective:  Patient ID: Jesse Nelson, male    DOB: May 07, 1970  Age: 52 y.o. MRN: [id number]  Chief Complaint  Patient presents with  . Follow-up    3 months lab results    No new complaints, here for lab review and medication refills. Labs reviewed and notable for slight improvement in triglycerides but still uncontrolled. TC and LDL remain at target. CMP notable for hyperkalemia but admits to difficult phlebotomy   No other concerns at this time.   Past Medical History:  Diagnosis Date  . Asthma   . GERD (gastroesophageal reflux disease)   . Hyperlipidemia   . Hypertension     No past surgical history on file.  Social History   Socioeconomic History  . Marital status: Married    Spouse name: Not on file  . Number of children: Not on file  . Years of education: Not on file  . Highest education level: Not on file  Occupational History  . Not on file  Tobacco Use  . Smoking status: Every Day    Packs/day: 1.00    Years: 26.00    Additional pack years: 0.00    Total pack years: 26.00    Types: Cigarettes  . Smokeless tobacco: Never  Substance and Sexual Activity  . Alcohol use: Not on file  . Drug use: Not on file  . Sexual activity: Not on file  Other Topics Concern  . Not on file  Social History Narrative  . Not on file   Social Determinants of Health   Financial Resource Strain: Not on file  Food Insecurity: Not on file  Transportation Needs: Not on file  Physical Activity: Not on file  Stress: Not on file  Social Connections: Not on file  Intimate Partner Violence: Not on file    No family history on file.  No Known Allergies  Review of Systems  Constitutional: Negative.   HENT: Negative.    Eyes: Negative.   Respiratory: Negative.    Cardiovascular: Negative.   Gastrointestinal: Negative.   Genitourinary: Negative.   Musculoskeletal:        Chronic pain  Skin: Negative.   Neurological: Negative.    Endo/Heme/Allergies: Negative.   Psychiatric/Behavioral: Negative.         Objective:   BP 138/72   Pulse (!) 104   Ht 5\' 8"  (1.727 m)   Wt 148 lb (67.1 kg)   SpO2 98%   BMI 22.50 kg/m   Vitals:   08/28/22 1109  BP: 138/72  Pulse: (!) 104  Height: 5\' 8"  (1.727 m)  Weight: 148 lb (67.1 kg)  SpO2: 98%  BMI (Calculated): 22.51    Physical Exam Vitals and nursing note reviewed.  Constitutional:      Appearance: Normal appearance.  HENT:     Head: Normocephalic.     Left Ear: There is no impacted cerumen.     Nose: Nose normal.     Mouth/Throat:     Mouth: Mucous membranes are moist.     Pharynx: No posterior oropharyngeal erythema.  Eyes:     Extraocular Movements: Extraocular movements intact.     Pupils: Pupils are equal, round, and reactive to light.  Cardiovascular:     Rate and Rhythm: Regular rhythm.     Chest Wall: PMI is not displaced.     Pulses: Normal pulses.     Heart sounds: Normal heart sounds. No murmur heard. Pulmonary:  Effort: Pulmonary effort is normal.     Breath sounds: Normal air entry. No rhonchi or rales.  Abdominal:     General: Abdomen is flat. Bowel sounds are normal. There is no distension.     Palpations: Abdomen is soft. There is no hepatomegaly, splenomegaly or mass.     Tenderness: There is no abdominal tenderness.  Musculoskeletal:        General: Normal range of motion.     Cervical back: Normal range of motion and neck supple.     Right lower leg: No edema.     Left lower leg: No edema.  Skin:    General: Skin is warm and dry.  Neurological:     General: No focal deficit present.     Mental Status: He is alert and oriented to person, place, and time.     Cranial Nerves: No cranial nerve deficit.     Motor: No weakness.  Psychiatric:        Mood and Affect: Mood normal.        Behavior: Behavior normal.     No results found for any visits on 08/28/22.  Recent Results (from the past 2160 hour(Faisal Stradling))  Lipid panel      Status: Abnormal   Collection Time: 08/26/22 10:16 AM  Result Value Ref Range   Cholesterol, Total 164 100 - 199 mg/dL   Triglycerides 811 (H) 0 - 149 mg/dL   HDL 50 >91 mg/dL   VLDL Cholesterol Cal 43 (H) 5 - 40 mg/dL   LDL Chol Calc (NIH) 71 0 - 99 mg/dL   Chol/HDL Ratio 3.3 0.0 - 5.0 ratio    Comment:                                   T. Chol/HDL Ratio                                             Men  Women                               1/2 Avg.Risk  3.4    3.3                                   Avg.Risk  5.0    4.4                                2X Avg.Risk  9.6    7.1                                3X Avg.Risk 23.4   11.0   Comprehensive metabolic panel     Status: Abnormal   Collection Time: 08/26/22 10:16 AM  Result Value Ref Range   Glucose 112 (H) 70 - 99 mg/dL   BUN 15 6 - 24 mg/dL   Creatinine, Ser 4.78 0.76 - 1.27 mg/dL   eGFR 72 >29 FA/OZH/0.86   BUN/Creatinine Ratio 12 9 - 20   Sodium 138 134 - 144 mmol/L   Potassium 5.3 (  H) 3.5 - 5.2 mmol/L   Chloride 99 96 - 106 mmol/L   CO2 22 20 - 29 mmol/L   Calcium 9.8 8.7 - 10.2 mg/dL   Total Protein 7.2 6.0 - 8.5 g/dL   Albumin 4.7 3.8 - 4.9 g/dL   Globulin, Total 2.5 1.5 - 4.5 g/dL   Albumin/Globulin Ratio 1.9 1.2 - 2.2   Bilirubin Total 0.2 0.0 - 1.2 mg/dL   Alkaline Phosphatase 70 44 - 121 IU/L   AST 31 0 - 40 IU/L   ALT 13 0 - 44 IU/L  Hemoglobin A1c     Status: Abnormal   Collection Time: 08/26/22 10:16 AM  Result Value Ref Range   Hgb A1c MFr Bld 5.8 (H) 4.8 - 5.6 %    Comment:          Prediabetes: 5.7 - 6.4          Diabetes: >6.4          Glycemic control for adults with diabetes: <7.0    Est. average glucose Bld gHb Est-mCnc 120 mg/dL      Assessment & Plan:  As per problem list Problem List Items Addressed This Visit       Cardiovascular and Mediastinum   Primary hypertension   Relevant Medications   VASCEPA 1 g capsule     Other   Mixed hyperlipidemia   Relevant Medications   VASCEPA 1 g  capsule   Other Relevant Orders   CK   Hepatic function panel   Lipid panel   Prediabetes   Other Visit Diagnoses     Hyperkalemia    -  Primary   Moderate persistent asthma without complication       Relevant Medications   albuterol (VENTOLIN HFA) 108 (90 Base) MCG/ACT inhaler       Return in about 3 months (around 11/28/2022) for fu with labs prior.   Total time spent: 20 minutes  Luna Fuse, MD  08/28/2022   This document may have been prepared by [facility] Voice Recognition software and as such may include unintentional dictation errors.

## 2022-10-02 ENCOUNTER — Other Ambulatory Visit: Payer: Self-pay | Admitting: Internal Medicine

## 2022-10-22 ENCOUNTER — Other Ambulatory Visit: Payer: Self-pay

## 2022-10-22 ENCOUNTER — Ambulatory Visit: Payer: Medicare Other | Admitting: Cardiovascular Disease

## 2022-10-22 ENCOUNTER — Encounter: Payer: Self-pay | Admitting: Cardiovascular Disease

## 2022-10-22 VITALS — BP 129/90 | HR 102 | Ht 68.0 in | Wt 153.4 lb

## 2022-10-22 DIAGNOSIS — I4711 Inappropriate sinus tachycardia, so stated: Secondary | ICD-10-CM

## 2022-10-22 DIAGNOSIS — R0602 Shortness of breath: Secondary | ICD-10-CM

## 2022-10-22 DIAGNOSIS — F17210 Nicotine dependence, cigarettes, uncomplicated: Secondary | ICD-10-CM

## 2022-10-22 DIAGNOSIS — I340 Nonrheumatic mitral (valve) insufficiency: Secondary | ICD-10-CM

## 2022-10-22 DIAGNOSIS — E782 Mixed hyperlipidemia: Secondary | ICD-10-CM

## 2022-10-22 DIAGNOSIS — K219 Gastro-esophageal reflux disease without esophagitis: Secondary | ICD-10-CM

## 2022-10-22 DIAGNOSIS — I10 Essential (primary) hypertension: Secondary | ICD-10-CM | POA: Diagnosis not present

## 2022-10-22 MED ORDER — PANTOPRAZOLE SODIUM 40 MG PO TBEC: 40.0000 mg | DELAYED_RELEASE_TABLET | Freq: Every day | ORAL | 1 refills | Status: DC

## 2022-10-22 MED ORDER — CLOPIDOGREL BISULFATE 75 MG PO TABS: 75.0000 mg | ORAL_TABLET | Freq: Every day | ORAL | 0 refills | Status: DC

## 2022-10-22 MED ORDER — METOPROLOL SUCCINATE ER 200 MG PO TB24: 200.0000 mg | ORAL_TABLET | Freq: Every day | ORAL | 11 refills | Status: DC

## 2022-10-22 NOTE — Progress Notes (Signed)
Cardiology Office Note   Date:  10/22/2022   ID:  LATHAN GIESELMAN, DOB 02/05/1971, MRN [id number]  PCP:  Sherron Monday, MD  Cardiologist:  Adrian Blackwater, MD      History of Present Illness: TAVI Jesse Nelson is a 52 y.o. male who presents for  Chief Complaint  Patient presents with   Follow-up    53mo F/U    Has heart rate up, feels hyper like.      Past Medical History:  Diagnosis Date   Asthma    GERD (gastroesophageal reflux disease)    Hyperlipidemia    Hypertension      History reviewed. No pertinent surgical history.   Current Outpatient Medications  Medication Sig Dispense Refill   metoprolol (TOPROL XL) 200 MG 24 hr tablet Take 1 tablet (200 mg total) by mouth daily. 30 tablet 11   pantoprazole (PROTONIX) 40 MG tablet Take 1 tablet (40 mg total) by mouth daily. 30 tablet 1   albuterol (VENTOLIN HFA) 108 (90 Base) MCG/ACT inhaler INHALE 1 PUFF EVERY 6 HOURS AS NEEDED 18 g 5   amLODipine (NORVASC) 5 MG tablet Take 1 tablet (5 mg total) by mouth every morning. 90 tablet 2   clopidogrel (PLAVIX) 75 MG tablet Take 1 tablet (75 mg total) by mouth daily. 90 tablet 0   fluticasone (FLONASE) 50 MCG/ACT nasal spray Place 2 sprays into both nostrils daily.     fluticasone-salmeterol (ADVAIR HFA) 115-21 MCG/ACT inhaler Inhale 2 puffs into the lungs 2 (two) times daily. (Patient not taking: Reported on 05/22/2022)     lisinopril-hydrochlorothiazide (ZESTORETIC) 20-12.5 MG tablet Take 2 tablets by mouth daily. 180 tablet 0   montelukast (SINGULAIR) 10 MG tablet TAKE 1 TABLET BY MOUTH EVERY MORNING 90 tablet 0   nitroGLYCERIN (NITROSTAT) 0.4 MG SL tablet Place 1 tablet (0.4 mg total) under the tongue every 5 (five) minutes as needed for chest pain. 100 tablet 3   oxyCODONE-acetaminophen (PERCOCET) 10-325 MG tablet Take 1 tablet by mouth every 4 (four) hours as needed for pain.     rosuvastatin (CRESTOR) 5 MG tablet TAKE 1 TABLET BY MOUTH EVERY NIGHT AT BEDTIME 90 tablet  0   VASCEPA 1 g capsule Take 2 capsules (2 g total) by mouth 2 (two) times daily. 360 capsule 1   No current facility-administered medications for this visit.    Allergies:   Patient has no known allergies.    Social History:   reports that he has been smoking cigarettes. He has a 26 pack-year smoking history. He has never used smokeless tobacco. No history on file for alcohol use and drug use.   Family History:  family history is not on file.    ROS:     Review of Systems  Constitutional: Negative.   HENT: Negative.    Eyes: Negative.   Respiratory: Negative.    Gastrointestinal: Negative.   Genitourinary: Negative.   Musculoskeletal: Negative.   Skin: Negative.   Neurological: Negative.   Endo/Heme/Allergies: Negative.   Psychiatric/Behavioral: Negative.    All other systems reviewed and are negative.     All other systems are reviewed and negative.    PHYSICAL EXAM: VS:  BP (!) 129/90   Pulse (!) 102   Ht 5\' 8"  (1.727 m)   Wt 153 lb 6.4 oz (69.6 kg)   SpO2 98%   BMI 23.32 kg/m  , BMI Body mass index is 23.32 kg/m. Last weight:  Wt Readings from Last 3  Encounters:  10/22/22 153 lb 6.4 oz (69.6 kg)  08/28/22 148 lb (67.1 kg)  07/23/22 151 lb (68.5 kg)     Physical Exam Vitals reviewed.  Constitutional:      Appearance: Normal appearance. He is normal weight.  HENT:     Head: Normocephalic.     Nose: Nose normal.     Mouth/Throat:     Mouth: Mucous membranes are moist.  Eyes:     Pupils: Pupils are equal, round, and reactive to light.  Cardiovascular:     Rate and Rhythm: Normal rate and regular rhythm.     Pulses: Normal pulses.     Heart sounds: Normal heart sounds.  Pulmonary:     Effort: Pulmonary effort is normal.  Abdominal:     General: Abdomen is flat. Bowel sounds are normal.  Musculoskeletal:        General: Normal range of motion.     Cervical back: Normal range of motion.  Skin:    General: Skin is warm.  Neurological:      General: No focal deficit present.     Mental Status: He is alert.  Psychiatric:        Mood and Affect: Mood normal.       EKG:   Recent Labs: 08/26/2022: ALT 13; BUN 15; Creatinine, Ser 1.21; Potassium 5.3; Sodium 138    Lipid Panel    Component Value Date/Time   CHOL 164 08/26/2022 1016   TRIG 265 (H) 08/26/2022 1016   HDL 50 08/26/2022 1016   CHOLHDL 3.3 08/26/2022 1016   LDLCALC 71 08/26/2022 1016      Other studies Reviewed: Additional studies/ records that were reviewed today include:  Review of the above records demonstrates:       No data to display            ASSESSMENT AND PLAN:    ICD-10-CM   1. Inappropriate sinus tachycardia  I47.11 metoprolol (TOPROL XL) 200 MG 24 hr tablet    clopidogrel (PLAVIX) 75 MG tablet    pantoprazole (PROTONIX) 40 MG tablet   heart rate over 100, will increase to metorolol200 daily    2. Mixed hyperlipidemia  E78.2 metoprolol (TOPROL XL) 200 MG 24 hr tablet    clopidogrel (PLAVIX) 75 MG tablet    pantoprazole (PROTONIX) 40 MG tablet    3. Primary hypertension  I10 metoprolol (TOPROL XL) 200 MG 24 hr tablet    clopidogrel (PLAVIX) 75 MG tablet    pantoprazole (PROTONIX) 40 MG tablet    4. Nonrheumatic mitral valve regurgitation  I34.0 metoprolol (TOPROL XL) 200 MG 24 hr tablet    clopidogrel (PLAVIX) 75 MG tablet    pantoprazole (PROTONIX) 40 MG tablet    5. SOB (shortness of breath)  R06.02 metoprolol (TOPROL XL) 200 MG 24 hr tablet    clopidogrel (PLAVIX) 75 MG tablet    pantoprazole (PROTONIX) 40 MG tablet    6. GERD without esophagitis  K21.9 metoprolol (TOPROL XL) 200 MG 24 hr tablet    clopidogrel (PLAVIX) 75 MG tablet    pantoprazole (PROTONIX) 40 MG tablet       Problem List Items Addressed This Visit       Cardiovascular and Mediastinum   Primary hypertension   Relevant Medications   metoprolol (TOPROL XL) 200 MG 24 hr tablet   clopidogrel (PLAVIX) 75 MG tablet   pantoprazole (PROTONIX) 40 MG  tablet     Other   Mixed hyperlipidemia   Relevant  Medications   metoprolol (TOPROL XL) 200 MG 24 hr tablet   clopidogrel (PLAVIX) 75 MG tablet   pantoprazole (PROTONIX) 40 MG tablet   Other Visit Diagnoses     Inappropriate sinus tachycardia    -  Primary   heart rate over 100, will increase to metorolol200 daily   Relevant Medications   metoprolol (TOPROL XL) 200 MG 24 hr tablet   clopidogrel (PLAVIX) 75 MG tablet   pantoprazole (PROTONIX) 40 MG tablet   Nonrheumatic mitral valve regurgitation       Relevant Medications   metoprolol (TOPROL XL) 200 MG 24 hr tablet   clopidogrel (PLAVIX) 75 MG tablet   pantoprazole (PROTONIX) 40 MG tablet   SOB (shortness of breath)       Relevant Medications   metoprolol (TOPROL XL) 200 MG 24 hr tablet   clopidogrel (PLAVIX) 75 MG tablet   pantoprazole (PROTONIX) 40 MG tablet   GERD without esophagitis       Relevant Medications   metoprolol (TOPROL XL) 200 MG 24 hr tablet   clopidogrel (PLAVIX) 75 MG tablet   pantoprazole (PROTONIX) 40 MG tablet          Disposition:   Return in about 5 weeks (around 11/26/2022).    Total time spent: 30 minutes  Signed,  Adrian Blackwater, MD  10/22/2022 11:06 AM    [facility]

## 2022-10-23 MED ORDER — PANTOPRAZOLE SODIUM 40 MG PO TBEC: 40.0000 mg | DELAYED_RELEASE_TABLET | Freq: Every day | ORAL | 1 refills | Status: DC

## 2022-11-01 ENCOUNTER — Other Ambulatory Visit: Payer: Self-pay | Admitting: Nurse Practitioner

## 2022-11-26 ENCOUNTER — Encounter: Payer: Self-pay | Admitting: Cardiovascular Disease

## 2022-11-26 ENCOUNTER — Ambulatory Visit (INDEPENDENT_AMBULATORY_CARE_PROVIDER_SITE_OTHER): Payer: Medicare Other | Admitting: Cardiovascular Disease

## 2022-11-26 VITALS — BP 123/71 | HR 88 | Ht 68.0 in | Wt 152.4 lb

## 2022-11-26 DIAGNOSIS — K219 Gastro-esophageal reflux disease without esophagitis: Secondary | ICD-10-CM

## 2022-11-26 DIAGNOSIS — I2510 Atherosclerotic heart disease of native coronary artery without angina pectoris: Secondary | ICD-10-CM

## 2022-11-26 DIAGNOSIS — I10 Essential (primary) hypertension: Secondary | ICD-10-CM | POA: Diagnosis not present

## 2022-11-26 DIAGNOSIS — I340 Nonrheumatic mitral (valve) insufficiency: Secondary | ICD-10-CM

## 2022-11-26 DIAGNOSIS — R0602 Shortness of breath: Secondary | ICD-10-CM

## 2022-11-26 DIAGNOSIS — R7303 Prediabetes: Secondary | ICD-10-CM | POA: Diagnosis not present

## 2022-11-26 DIAGNOSIS — E782 Mixed hyperlipidemia: Secondary | ICD-10-CM | POA: Diagnosis not present

## 2022-11-26 DIAGNOSIS — I4711 Inappropriate sinus tachycardia, so stated: Secondary | ICD-10-CM

## 2022-11-26 MED ORDER — METOPROLOL SUCCINATE ER 200 MG PO TB24: 200.0000 mg | ORAL_TABLET | Freq: Every day | ORAL | 3 refills | Status: DC

## 2022-11-26 NOTE — Progress Notes (Signed)
Cardiology Office Note   Date:  11/26/2022   ID:  Jesse Nelson, DOB [birth year], MRN [id number]  PCP:  Sherron Monday, MD  Cardiologist:  Adrian Blackwater, MD      History of Present Illness: Jesse Nelson is a 52 y.o. male who presents for  Chief Complaint  Patient presents with   Follow-up    Doing fine      Past Medical History:  Diagnosis Date   Asthma    GERD (gastroesophageal reflux disease)    Hyperlipidemia    Hypertension      History reviewed. No pertinent surgical history.   Current Outpatient Medications  Medication Sig Dispense Refill   albuterol (VENTOLIN HFA) 108 (90 Base) MCG/ACT inhaler INHALE 1 PUFF EVERY 6 HOURS AS NEEDED 18 g 5   amLODipine (NORVASC) 5 MG tablet Take 1 tablet (5 mg total) by mouth every morning. 90 tablet 2   clopidogrel (PLAVIX) 75 MG tablet Take 1 tablet (75 mg total) by mouth daily. 90 tablet 0   fluticasone (FLONASE) 50 MCG/ACT nasal spray Place 2 sprays into both nostrils daily.     fluticasone-salmeterol (ADVAIR HFA) 115-21 MCG/ACT inhaler Inhale 2 puffs into the lungs 2 (two) times daily. (Patient not taking: Reported on 05/22/2022)     lisinopril-hydrochlorothiazide (ZESTORETIC) 20-12.5 MG tablet Take 2 tablets by mouth daily. 180 tablet 0   metoprolol (TOPROL XL) 200 MG 24 hr tablet Take 1 tablet (200 mg total) by mouth daily. 90 tablet 3   montelukast (SINGULAIR) 10 MG tablet TAKE 1 TABLET BY MOUTH EVERY MORNING 90 tablet 1   nitroGLYCERIN (NITROSTAT) 0.4 MG SL tablet Place 1 tablet (0.4 mg total) under the tongue every 5 (five) minutes as needed for chest pain. 100 tablet 3   oxyCODONE-acetaminophen (PERCOCET) 10-325 MG tablet Take 1 tablet by mouth every 4 (four) hours as needed for pain.     pantoprazole (PROTONIX) 40 MG tablet Take 1 tablet (40 mg total) by mouth daily. 30 tablet 1   rosuvastatin (CRESTOR) 5 MG tablet TAKE 1 TABLET BY MOUTH EVERY NIGHT AT BEDTIME 90 tablet 1   VASCEPA 1 g capsule Take 2  capsules (2 g total) by mouth 2 (two) times daily. 360 capsule 1   No current facility-administered medications for this visit.    Allergies:   Patient has no known allergies.    Social History:   reports that he has been smoking cigarettes. He has a 26 pack-year smoking history. He has never used smokeless tobacco. No history on file for alcohol use and drug use.   Family History:  family history is not on file.    ROS:     Review of Systems  Constitutional: Negative.   HENT: Negative.    Eyes: Negative.   Respiratory: Negative.    Gastrointestinal: Negative.   Genitourinary: Negative.   Musculoskeletal: Negative.   Skin: Negative.   Neurological: Negative.   Endo/Heme/Allergies: Negative.   Psychiatric/Behavioral: Negative.    All other systems reviewed and are negative.     All other systems are reviewed and negative.    PHYSICAL EXAM: VS:  BP 123/71   Pulse 88   Ht 5\' 8"  (1.727 m)   Wt 152 lb 6.4 oz (69.1 kg)   SpO2 98%   BMI 23.17 kg/m  , BMI Body mass index is 23.17 kg/m. Last weight:  Wt Readings from Last 3 Encounters:  11/26/22 152 lb 6.4 oz (69.1 kg)  10/22/22 153  lb 6.4 oz (69.6 kg)  08/28/22 148 lb (67.1 kg)     Physical Exam Vitals reviewed.  Constitutional:      Appearance: Normal appearance. He is normal weight.  HENT:     Head: Normocephalic.     Nose: Nose normal.     Mouth/Throat:     Mouth: Mucous membranes are moist.  Eyes:     Pupils: Pupils are equal, round, and reactive to light.  Cardiovascular:     Rate and Rhythm: Normal rate and regular rhythm.     Pulses: Normal pulses.     Heart sounds: Normal heart sounds.  Pulmonary:     Effort: Pulmonary effort is normal.  Abdominal:     General: Abdomen is flat. Bowel sounds are normal.  Musculoskeletal:        General: Normal range of motion.     Cervical back: Normal range of motion.  Skin:    General: Skin is warm.  Neurological:     General: No focal deficit present.      Mental Status: He is alert.  Psychiatric:        Mood and Affect: Mood normal.       EKG:   Recent Labs: 08/26/2022: ALT 13; BUN 15; Creatinine, Ser 1.21; Potassium 5.3; Sodium 138    Lipid Panel    Component Value Date/Time   CHOL 164 08/26/2022 1016   TRIG 265 (H) 08/26/2022 1016   HDL 50 08/26/2022 1016   CHOLHDL 3.3 08/26/2022 1016   LDLCALC 71 08/26/2022 1016      Other studies Reviewed: Additional studies/ records that were reviewed today include:  Review of the above records demonstrates:       No data to display            ASSESSMENT AND PLAN:    ICD-10-CM   1. Primary hypertension  I10 metoprolol (TOPROL XL) 200 MG 24 hr tablet    MYOCARDIAL PERFUSION IMAGING    2. Mixed hyperlipidemia  E78.2 metoprolol (TOPROL XL) 200 MG 24 hr tablet    MYOCARDIAL PERFUSION IMAGING    3. Prediabetes  R73.03 metoprolol (TOPROL XL) 200 MG 24 hr tablet    MYOCARDIAL PERFUSION IMAGING    4. Nonrheumatic mitral valve regurgitation  I34.0 metoprolol (TOPROL XL) 200 MG 24 hr tablet    MYOCARDIAL PERFUSION IMAGING    5. SOB (shortness of breath)  R06.02 metoprolol (TOPROL XL) 200 MG 24 hr tablet    MYOCARDIAL PERFUSION IMAGING   will do stress test    6. GERD without esophagitis  K21.9 metoprolol (TOPROL XL) 200 MG 24 hr tablet    MYOCARDIAL PERFUSION IMAGING    7. Inappropriate sinus tachycardia  I47.11 metoprolol (TOPROL XL) 200 MG 24 hr tablet    MYOCARDIAL PERFUSION IMAGING   heart rate over 100, will increase to metorolol200 daily    8. Coronary artery disease involving native coronary artery of native heart without angina pectoris  I25.10 MYOCARDIAL PERFUSION IMAGING   CCTa showed 50%, LAD, LCX with ca score 500 in past, advise stress test       Problem List Items Addressed This Visit       Cardiovascular and Mediastinum   Primary hypertension - Primary   Relevant Medications   metoprolol (TOPROL XL) 200 MG 24 hr tablet   Other Relevant Orders    MYOCARDIAL PERFUSION IMAGING     Other   Mixed hyperlipidemia   Relevant Medications   metoprolol (TOPROL XL) 200  MG 24 hr tablet   Other Relevant Orders   MYOCARDIAL PERFUSION IMAGING   Prediabetes   Relevant Medications   metoprolol (TOPROL XL) 200 MG 24 hr tablet   Other Relevant Orders   MYOCARDIAL PERFUSION IMAGING   Other Visit Diagnoses     Nonrheumatic mitral valve regurgitation       Relevant Medications   metoprolol (TOPROL XL) 200 MG 24 hr tablet   Other Relevant Orders   MYOCARDIAL PERFUSION IMAGING   SOB (shortness of breath)       will do stress test   Relevant Medications   metoprolol (TOPROL XL) 200 MG 24 hr tablet   Other Relevant Orders   MYOCARDIAL PERFUSION IMAGING   GERD without esophagitis       Relevant Medications   metoprolol (TOPROL XL) 200 MG 24 hr tablet   Other Relevant Orders   MYOCARDIAL PERFUSION IMAGING   Inappropriate sinus tachycardia       heart rate over 100, will increase to metorolol200 daily   Relevant Medications   metoprolol (TOPROL XL) 200 MG 24 hr tablet   Other Relevant Orders   MYOCARDIAL PERFUSION IMAGING   Coronary artery disease involving native coronary artery of native heart without angina pectoris       CCTa showed 50%, LAD, LCX with ca score 500 in past, advise stress test   Relevant Medications   metoprolol (TOPROL XL) 200 MG 24 hr tablet   Other Relevant Orders   MYOCARDIAL PERFUSION IMAGING          Disposition:   Return in about 4 weeks (around 12/24/2022) for stress test and f/u.    Total time spent: 30 minutes  Signed,  Adrian Blackwater, MD  11/26/2022 11:36 AM    [facility]

## 2022-11-27 ENCOUNTER — Other Ambulatory Visit: Payer: Medicare Other

## 2022-11-27 DIAGNOSIS — E782 Mixed hyperlipidemia: Secondary | ICD-10-CM

## 2022-11-28 LAB — LIPID PANEL
Chol/HDL Ratio: 3.3 ratio (ref 0.0–5.0)
Cholesterol, Total: 162 mg/dL (ref 100–199)
HDL: 49 mg/dL (ref >39)
LDL Chol Calc (NIH): 71 mg/dL (ref 0–99)
Triglycerides: 259 mg/dL — ABNORMAL HIGH (ref 0–149)
VLDL Cholesterol Cal: 42 mg/dL — ABNORMAL HIGH (ref 5–40)

## 2022-11-28 LAB — HEPATIC FUNCTION PANEL
ALT: 18 IU/L (ref 0–44)
AST: 34 IU/L (ref 0–40)
Albumin: 4.6 g/dL (ref 3.8–4.9)
Alkaline Phosphatase: 75 IU/L (ref 44–121)
Bilirubin Total: 0.3 mg/dL (ref 0.0–1.2)
Bilirubin, Direct: 0.13 mg/dL (ref 0.00–0.40)
Total Protein: 7 g/dL (ref 6.0–8.5)

## 2022-11-28 LAB — CK: Total CK: 100 U/L (ref 41–331)

## 2022-11-30 ENCOUNTER — Ambulatory Visit (INDEPENDENT_AMBULATORY_CARE_PROVIDER_SITE_OTHER): Payer: Medicare Other | Admitting: Internal Medicine

## 2022-11-30 ENCOUNTER — Encounter: Payer: Self-pay | Admitting: Internal Medicine

## 2022-11-30 VITALS — BP 119/70 | HR 86 | Ht 68.0 in | Wt 153.3 lb

## 2022-11-30 DIAGNOSIS — N400 Enlarged prostate without lower urinary tract symptoms: Secondary | ICD-10-CM | POA: Diagnosis not present

## 2022-11-30 DIAGNOSIS — E782 Mixed hyperlipidemia: Secondary | ICD-10-CM

## 2022-11-30 DIAGNOSIS — R7303 Prediabetes: Secondary | ICD-10-CM | POA: Diagnosis not present

## 2022-11-30 DIAGNOSIS — I10 Essential (primary) hypertension: Secondary | ICD-10-CM

## 2022-11-30 DIAGNOSIS — F17210 Nicotine dependence, cigarettes, uncomplicated: Secondary | ICD-10-CM | POA: Diagnosis not present

## 2022-11-30 DIAGNOSIS — K219 Gastro-esophageal reflux disease without esophagitis: Secondary | ICD-10-CM | POA: Insufficient documentation

## 2022-11-30 MED ORDER — OMEPRAZOLE 40 MG PO CPDR
40.0000 mg | DELAYED_RELEASE_CAPSULE | Freq: Every day | ORAL | 1 refills | Status: DC
Start: 2022-11-30 — End: 2023-05-17

## 2022-11-30 NOTE — Progress Notes (Signed)
Established Patient Office Visit  Subjective:  Patient ID: Jesse Nelson, male    DOB: 22-Oct-1970  Age: 52 y.o. MRN: [id number]  Chief Complaint  Patient presents with   Follow-up    No new complaints, here for lab review and medication refills.LDL and TC well controlled on lab review. Triglycerides remain elevated however.      No other concerns at this time.   Past Medical History:  Diagnosis Date   Asthma    GERD (gastroesophageal reflux disease)    Hyperlipidemia    Hypertension     No past surgical history on file.  Social History   Socioeconomic History   Marital status: Married    Spouse name: Not on file   Number of children: Not on file   Years of education: Not on file   Highest education level: Not on file  Occupational History   Not on file  Tobacco Use   Smoking status: Every Day    Current packs/day: 1.00    Average packs/day: 1 pack/day for 26.0 years (26.0 ttl pk-yrs)    Types: Cigarettes   Smokeless tobacco: Never  Substance and Sexual Activity   Alcohol use: Not on file   Drug use: Not on file   Sexual activity: Not on file  Other Topics Concern   Not on file  Social History Narrative   Not on file   Social Determinants of Health   Financial Resource Strain: Not on file  Food Insecurity: Not on file  Transportation Needs: Not on file  Physical Activity: Not on file  Stress: Not on file  Social Connections: Not on file  Intimate Partner Violence: Not on file    No family history on file.  No Known Allergies  Review of Systems  Constitutional: Negative.   HENT: Negative.    Eyes: Negative.   Respiratory: Negative.    Cardiovascular: Negative.   Gastrointestinal: Negative.   Genitourinary: Negative.   Musculoskeletal:        Chronic pain  Skin: Negative.   Neurological: Negative.   Endo/Heme/Allergies: Negative.   Psychiatric/Behavioral: Negative.         Objective:   BP 119/70   Pulse 86   Ht 5\' 8"  (1.727 m)    Wt 153 lb 4.8 oz (69.5 kg)   SpO2 98%   BMI 23.31 kg/m   Vitals:   11/30/22 1114  BP: 119/70  Pulse: 86  Height: 5\' 8"  (1.727 m)  Weight: 153 lb 4.8 oz (69.5 kg)  SpO2: 98%  BMI (Calculated): 23.31    Physical Exam Vitals and nursing note reviewed.  Constitutional:      Appearance: Normal appearance.  HENT:     Head: Normocephalic.     Left Ear: There is no impacted cerumen.     Nose: Nose normal.     Mouth/Throat:     Mouth: Mucous membranes are moist.     Pharynx: No posterior oropharyngeal erythema.  Eyes:     Extraocular Movements: Extraocular movements intact.     Pupils: Pupils are equal, round, and reactive to light.  Cardiovascular:     Rate and Rhythm: Regular rhythm.     Chest Wall: PMI is not displaced.     Pulses: Normal pulses.     Heart sounds: Normal heart sounds. No murmur heard. Pulmonary:     Effort: Pulmonary effort is normal.     Breath sounds: Normal air entry. No rhonchi or rales.  Abdominal:  General: Abdomen is flat. Bowel sounds are normal. There is no distension.     Palpations: Abdomen is soft. There is no hepatomegaly, splenomegaly or mass.     Tenderness: There is no abdominal tenderness.  Musculoskeletal:        General: Normal range of motion.     Cervical back: Normal range of motion and neck supple.     Right lower leg: No edema.     Left lower leg: No edema.  Skin:    General: Skin is warm and dry.  Neurological:     General: No focal deficit present.     Mental Status: He is alert and oriented to person, place, and time.     Cranial Nerves: No cranial nerve deficit.     Motor: No weakness.  Psychiatric:        Mood and Affect: Mood normal.        Behavior: Behavior normal.      No results found for any visits on 11/30/22.  Recent Results (from the past 2160 hour(Jetaun Colbath))  Lipid panel     Status: Abnormal   Collection Time: 11/27/22  9:57 AM  Result Value Ref Range   Cholesterol, Total 162 100 - 199 mg/dL    Triglycerides 161 (H) 0 - 149 mg/dL   HDL 49 >09 mg/dL   VLDL Cholesterol Cal 42 (H) 5 - 40 mg/dL   LDL Chol Calc (NIH) 71 0 - 99 mg/dL   Chol/HDL Ratio 3.3 0.0 - 5.0 ratio    Comment:                                   T. Chol/HDL Ratio                                             Men  Women                               1/2 Avg.Risk  3.4    3.3                                   Avg.Risk  5.0    4.4                                2X Avg.Risk  9.6    7.1                                3X Avg.Risk 23.4   11.0   Hepatic function panel     Status: None   Collection Time: 11/27/22  9:57 AM  Result Value Ref Range   Total Protein 7.0 6.0 - 8.5 g/dL   Albumin 4.6 3.8 - 4.9 g/dL   Bilirubin Total 0.3 0.0 - 1.2 mg/dL   Bilirubin, Direct 6.04 0.00 - 0.40 mg/dL   Alkaline Phosphatase 75 44 - 121 IU/L   AST 34 0 - 40 IU/L   ALT 18 0 - 44 IU/L  CK     Status: None   Collection Time: 11/27/22  9:57 AM  Result Value Ref Range   Total CK 100 41 - 331 U/L      Assessment & Plan:  As per problem list. Stricter low calorie diet, low cholesterol and low fat diet and exercise as much as possible.   Problem List Items Addressed This Visit       Cardiovascular and Mediastinum   Primary hypertension - Primary   Relevant Orders   CBC With Diff/Platelet   Comprehensive metabolic panel     Digestive   GERD without esophagitis   Relevant Medications   omeprazole (PRILOSEC) 40 MG capsule     Other   Mixed hyperlipidemia   Prediabetes   Relevant Orders   Hemoglobin A1c   Other Visit Diagnoses     Benign prostatic hyperplasia without lower urinary tract symptoms       Relevant Orders   PSA       Return in about 3 months (around 03/02/2023) for cpe with labs prior.   Total time spent: 20 minutes  Luna Fuse, MD  11/30/2022   This document may have been prepared by [facility] Voice Recognition software and as such may include unintentional dictation errors.

## 2022-12-08 ENCOUNTER — Ambulatory Visit (INDEPENDENT_AMBULATORY_CARE_PROVIDER_SITE_OTHER): Payer: Medicare Other

## 2022-12-08 DIAGNOSIS — I10 Essential (primary) hypertension: Secondary | ICD-10-CM

## 2022-12-08 DIAGNOSIS — R0602 Shortness of breath: Secondary | ICD-10-CM | POA: Diagnosis not present

## 2022-12-08 DIAGNOSIS — I2510 Atherosclerotic heart disease of native coronary artery without angina pectoris: Secondary | ICD-10-CM | POA: Diagnosis not present

## 2022-12-08 DIAGNOSIS — R7303 Prediabetes: Secondary | ICD-10-CM

## 2022-12-08 DIAGNOSIS — E782 Mixed hyperlipidemia: Secondary | ICD-10-CM

## 2022-12-08 DIAGNOSIS — I340 Nonrheumatic mitral (valve) insufficiency: Secondary | ICD-10-CM | POA: Diagnosis not present

## 2022-12-08 DIAGNOSIS — I4711 Inappropriate sinus tachycardia, so stated: Secondary | ICD-10-CM

## 2022-12-08 DIAGNOSIS — K219 Gastro-esophageal reflux disease without esophagitis: Secondary | ICD-10-CM

## 2022-12-08 MED ORDER — TECHNETIUM TC 99M SESTAMIBI GENERIC - CARDIOLITE
9.5000 | Freq: Once | INTRAVENOUS | Status: AC | PRN
  Administered 2022-12-08: 9.5 via INTRAVENOUS

## 2022-12-08 MED ORDER — TECHNETIUM TC 99M SESTAMIBI GENERIC - CARDIOLITE
33.4000 | Freq: Once | INTRAVENOUS | Status: AC | PRN
  Administered 2022-12-08: 33.4 via INTRAVENOUS

## 2022-12-15 ENCOUNTER — Ambulatory Visit (INDEPENDENT_AMBULATORY_CARE_PROVIDER_SITE_OTHER): Payer: Medicare Other | Admitting: Cardiovascular Disease

## 2022-12-15 ENCOUNTER — Encounter: Payer: Self-pay | Admitting: Cardiovascular Disease

## 2022-12-15 VITALS — BP 125/80 | HR 91 | Ht 68.0 in | Wt 152.6 lb

## 2022-12-15 DIAGNOSIS — I2510 Atherosclerotic heart disease of native coronary artery without angina pectoris: Secondary | ICD-10-CM

## 2022-12-15 DIAGNOSIS — I10 Essential (primary) hypertension: Secondary | ICD-10-CM

## 2022-12-15 DIAGNOSIS — K219 Gastro-esophageal reflux disease without esophagitis: Secondary | ICD-10-CM | POA: Diagnosis not present

## 2022-12-15 DIAGNOSIS — I340 Nonrheumatic mitral (valve) insufficiency: Secondary | ICD-10-CM

## 2022-12-15 DIAGNOSIS — R7303 Prediabetes: Secondary | ICD-10-CM

## 2022-12-15 DIAGNOSIS — E782 Mixed hyperlipidemia: Secondary | ICD-10-CM

## 2022-12-15 NOTE — Progress Notes (Signed)
Cardiology Office Note   Date:  12/15/2022   ID:  Jesse Nelson, DOB 19-May-1970, MRN [id number]  PCP:  Sherron Monday, MD  Cardiologist:  Adrian Blackwater, MD      History of Present Illness: Jesse Nelson is a 52 y.o. male who presents for  Chief Complaint  Patient presents with   Follow-up    1 mo f/u    Feeling fine      Past Medical History:  Diagnosis Date   Asthma    GERD (gastroesophageal reflux disease)    Hyperlipidemia    Hypertension      History reviewed. No pertinent surgical history.   Current Outpatient Medications  Medication Sig Dispense Refill   albuterol (VENTOLIN HFA) 108 (90 Base) MCG/ACT inhaler INHALE 1 PUFF EVERY 6 HOURS AS NEEDED 18 g 5   amLODipine (NORVASC) 5 MG tablet Take 1 tablet (5 mg total) by mouth every morning. 90 tablet 2   clopidogrel (PLAVIX) 75 MG tablet Take 1 tablet (75 mg total) by mouth daily. 90 tablet 0   fluticasone (FLONASE) 50 MCG/ACT nasal spray Place 2 sprays into both nostrils daily.     fluticasone-salmeterol (ADVAIR HFA) 115-21 MCG/ACT inhaler Inhale 2 puffs into the lungs 2 (two) times daily. (Patient not taking: Reported on 05/22/2022)     lisinopril-hydrochlorothiazide (ZESTORETIC) 20-12.5 MG tablet Take 2 tablets by mouth daily. 180 tablet 0   metoprolol (TOPROL XL) 200 MG 24 hr tablet Take 1 tablet (200 mg total) by mouth daily. 90 tablet 3   montelukast (SINGULAIR) 10 MG tablet TAKE 1 TABLET BY MOUTH EVERY MORNING 90 tablet 1   nitroGLYCERIN (NITROSTAT) 0.4 MG SL tablet Place 1 tablet (0.4 mg total) under the tongue every 5 (five) minutes as needed for chest pain. 100 tablet 3   omeprazole (PRILOSEC) 40 MG capsule Take 1 capsule (40 mg total) by mouth daily. 90 capsule 1   oxyCODONE-acetaminophen (PERCOCET) 10-325 MG tablet Take 1 tablet by mouth every 4 (four) hours as needed for pain.     rosuvastatin (CRESTOR) 5 MG tablet TAKE 1 TABLET BY MOUTH EVERY NIGHT AT BEDTIME 90 tablet 1   VASCEPA 1 g  capsule Take 2 capsules (2 g total) by mouth 2 (two) times daily. 360 capsule 1   No current facility-administered medications for this visit.    Allergies:   Patient has no known allergies.    Social History:   reports that he has been smoking cigarettes. He has a 26 pack-year smoking history. He has never used smokeless tobacco. No history on file for alcohol use and drug use.   Family History:  family history is not on file.    ROS:     Review of Systems  Constitutional: Negative.   HENT: Negative.    Eyes: Negative.   Respiratory: Negative.    Gastrointestinal: Negative.   Genitourinary: Negative.   Musculoskeletal: Negative.   Skin: Negative.   Neurological: Negative.   Endo/Heme/Allergies: Negative.   Psychiatric/Behavioral: Negative.    All other systems reviewed and are negative.     All other systems are reviewed and negative.    PHYSICAL EXAM: VS:  BP 125/80   Pulse 91   Ht 5\' 8"  (1.727 m)   Wt 152 lb 9.6 oz (69.2 kg)   SpO2 97%   BMI 23.20 kg/m  , BMI Body mass index is 23.2 kg/m. Last weight:  Wt Readings from Last 3 Encounters:  12/15/22 152 lb 9.6  oz (69.2 kg)  11/30/22 153 lb 4.8 oz (69.5 kg)  11/26/22 152 lb 6.4 oz (69.1 kg)     Physical Exam Vitals reviewed.  Constitutional:      Appearance: Normal appearance. He is normal weight.  HENT:     Head: Normocephalic.     Nose: Nose normal.     Mouth/Throat:     Mouth: Mucous membranes are moist.  Eyes:     Pupils: Pupils are equal, round, and reactive to light.  Cardiovascular:     Rate and Rhythm: Normal rate and regular rhythm.     Pulses: Normal pulses.     Heart sounds: Normal heart sounds.  Pulmonary:     Effort: Pulmonary effort is normal.  Abdominal:     General: Abdomen is flat. Bowel sounds are normal.  Musculoskeletal:        General: Normal range of motion.     Cervical back: Normal range of motion.  Skin:    General: Skin is warm.  Neurological:     General: No focal  deficit present.     Mental Status: He is alert.  Psychiatric:        Mood and Affect: Mood normal.       EKG:   Recent Labs: 08/26/2022: BUN 15; Creatinine, Ser 1.21; Potassium 5.3; Sodium 138 11/27/2022: ALT 18    Lipid Panel    Component Value Date/Time   CHOL 162 11/27/2022 0957   TRIG 259 (H) 11/27/2022 0957   HDL 49 11/27/2022 0957   CHOLHDL 3.3 11/27/2022 0957   LDLCALC 71 11/27/2022 0957      Other studies Reviewed: Additional studies/ records that were reviewed today include:  Review of the above records demonstrates:       No data to display            ASSESSMENT AND PLAN:    ICD-10-CM   1. Primary hypertension  I10     2. GERD without esophagitis  K21.9     3. Prediabetes  R73.03     4. Nonrheumatic mitral valve regurgitation  I34.0     5. Mixed hyperlipidemia  E78.2     6. Coronary artery disease involving native coronary artery of native heart without angina pectoris  I25.10    Stress test normal, no ischaemia, LVEF 88%. Doing well       Problem List Items Addressed This Visit       Cardiovascular and Mediastinum   Primary hypertension - Primary     Digestive   GERD without esophagitis     Other   Mixed hyperlipidemia   Prediabetes   Other Visit Diagnoses     Nonrheumatic mitral valve regurgitation       Coronary artery disease involving native coronary artery of native heart without angina pectoris       Stress test normal, no ischaemia, LVEF 88%. Doing well          Disposition:   Return in about 3 months (around 03/16/2023).    Total time spent: 30 minutes  Signed,  Adrian Blackwater, MD  12/15/2022 11:33 AM    [facility]

## 2023-01-07 ENCOUNTER — Other Ambulatory Visit: Payer: Self-pay | Admitting: Cardiovascular Disease

## 2023-01-26 ENCOUNTER — Other Ambulatory Visit: Payer: Self-pay | Admitting: Cardiovascular Disease

## 2023-01-26 DIAGNOSIS — E782 Mixed hyperlipidemia: Secondary | ICD-10-CM

## 2023-01-26 DIAGNOSIS — I4711 Inappropriate sinus tachycardia, so stated: Secondary | ICD-10-CM

## 2023-01-26 DIAGNOSIS — R0602 Shortness of breath: Secondary | ICD-10-CM

## 2023-01-26 DIAGNOSIS — I10 Essential (primary) hypertension: Secondary | ICD-10-CM

## 2023-01-26 DIAGNOSIS — K219 Gastro-esophageal reflux disease without esophagitis: Secondary | ICD-10-CM

## 2023-01-26 DIAGNOSIS — I340 Nonrheumatic mitral (valve) insufficiency: Secondary | ICD-10-CM

## 2023-01-28 ENCOUNTER — Other Ambulatory Visit: Payer: Self-pay | Admitting: Cardiovascular Disease

## 2023-01-28 DIAGNOSIS — I340 Nonrheumatic mitral (valve) insufficiency: Secondary | ICD-10-CM

## 2023-01-28 DIAGNOSIS — I4711 Inappropriate sinus tachycardia, so stated: Secondary | ICD-10-CM

## 2023-01-28 DIAGNOSIS — E782 Mixed hyperlipidemia: Secondary | ICD-10-CM

## 2023-01-28 DIAGNOSIS — R0602 Shortness of breath: Secondary | ICD-10-CM

## 2023-01-28 DIAGNOSIS — I10 Essential (primary) hypertension: Secondary | ICD-10-CM

## 2023-01-28 DIAGNOSIS — K219 Gastro-esophageal reflux disease without esophagitis: Secondary | ICD-10-CM

## 2023-02-05 ENCOUNTER — Other Ambulatory Visit: Payer: Self-pay | Admitting: Cardiovascular Disease

## 2023-02-05 DIAGNOSIS — I10 Essential (primary) hypertension: Secondary | ICD-10-CM

## 2023-02-05 DIAGNOSIS — E782 Mixed hyperlipidemia: Secondary | ICD-10-CM

## 2023-02-05 DIAGNOSIS — I4711 Inappropriate sinus tachycardia, so stated: Secondary | ICD-10-CM

## 2023-02-05 DIAGNOSIS — R0602 Shortness of breath: Secondary | ICD-10-CM

## 2023-02-05 DIAGNOSIS — K219 Gastro-esophageal reflux disease without esophagitis: Secondary | ICD-10-CM

## 2023-02-05 DIAGNOSIS — I340 Nonrheumatic mitral (valve) insufficiency: Secondary | ICD-10-CM

## 2023-02-14 ENCOUNTER — Other Ambulatory Visit: Payer: Self-pay | Admitting: Cardiovascular Disease

## 2023-02-14 DIAGNOSIS — I340 Nonrheumatic mitral (valve) insufficiency: Secondary | ICD-10-CM

## 2023-02-14 DIAGNOSIS — K219 Gastro-esophageal reflux disease without esophagitis: Secondary | ICD-10-CM

## 2023-02-14 DIAGNOSIS — E782 Mixed hyperlipidemia: Secondary | ICD-10-CM

## 2023-02-14 DIAGNOSIS — I4711 Inappropriate sinus tachycardia, so stated: Secondary | ICD-10-CM

## 2023-02-14 DIAGNOSIS — R0602 Shortness of breath: Secondary | ICD-10-CM

## 2023-02-14 DIAGNOSIS — I10 Essential (primary) hypertension: Secondary | ICD-10-CM

## 2023-03-08 ENCOUNTER — Encounter: Payer: Medicare Other | Admitting: Internal Medicine

## 2023-03-15 ENCOUNTER — Encounter: Payer: Self-pay | Admitting: Cardiovascular Disease

## 2023-03-15 ENCOUNTER — Ambulatory Visit (INDEPENDENT_AMBULATORY_CARE_PROVIDER_SITE_OTHER): Payer: Medicare Other | Admitting: Cardiovascular Disease

## 2023-03-15 VITALS — BP 130/72 | HR 103 | Ht 68.0 in | Wt 152.2 lb

## 2023-03-15 DIAGNOSIS — I2510 Atherosclerotic heart disease of native coronary artery without angina pectoris: Secondary | ICD-10-CM | POA: Diagnosis not present

## 2023-03-15 DIAGNOSIS — E782 Mixed hyperlipidemia: Secondary | ICD-10-CM

## 2023-03-15 DIAGNOSIS — R7303 Prediabetes: Secondary | ICD-10-CM | POA: Diagnosis not present

## 2023-03-15 DIAGNOSIS — I10 Essential (primary) hypertension: Secondary | ICD-10-CM

## 2023-03-15 DIAGNOSIS — K219 Gastro-esophageal reflux disease without esophagitis: Secondary | ICD-10-CM

## 2023-03-15 DIAGNOSIS — F17200 Nicotine dependence, unspecified, uncomplicated: Secondary | ICD-10-CM

## 2023-03-15 DIAGNOSIS — I4711 Inappropriate sinus tachycardia, so stated: Secondary | ICD-10-CM

## 2023-03-15 DIAGNOSIS — I340 Nonrheumatic mitral (valve) insufficiency: Secondary | ICD-10-CM | POA: Diagnosis not present

## 2023-03-15 DIAGNOSIS — R0602 Shortness of breath: Secondary | ICD-10-CM

## 2023-03-15 NOTE — Progress Notes (Signed)
Cardiology Office Note   Date:  03/15/2023   ID:  Jesse Nelson, DOB [birth year], MRN [id number]  PCP:  Sherron Monday, MD  Cardiologist:  Adrian Blackwater, MD      History of Present Illness: Jesse Nelson is a 52 y.o. male who presents for  Chief Complaint  Patient presents with   Follow-up    Has SOB  Shortness of Breath This is a recurrent problem. The current episode started more than 1 year ago. The problem has been waxing and waning.      Past Medical History:  Diagnosis Date   Asthma    GERD (gastroesophageal reflux disease)    Hyperlipidemia    Hypertension      History reviewed. No pertinent surgical history.   Current Outpatient Medications  Medication Sig Dispense Refill   albuterol (VENTOLIN HFA) 108 (90 Base) MCG/ACT inhaler INHALE 1 PUFF EVERY 6 HOURS AS NEEDED 18 g 5   amLODipine (NORVASC) 5 MG tablet Take 1 tablet (5 mg total) by mouth every morning. 90 tablet 2   clopidogrel (PLAVIX) 75 MG tablet TAKE 1 TABLET(75 MG) BY MOUTH DAILY 90 tablet 0   fluticasone (FLONASE) 50 MCG/ACT nasal spray Place 2 sprays into both nostrils daily.     fluticasone-salmeterol (ADVAIR HFA) 115-21 MCG/ACT inhaler Inhale 2 puffs into the lungs 2 (two) times daily. (Patient not taking: Reported on 05/22/2022)     lisinopril-hydrochlorothiazide (ZESTORETIC) 20-12.5 MG tablet TAKE 2 TABLETS BY MOUTH DAILY 180 tablet 0   metoprolol (TOPROL XL) 200 MG 24 hr tablet Take 1 tablet (200 mg total) by mouth daily. 90 tablet 3   montelukast (SINGULAIR) 10 MG tablet TAKE 1 TABLET BY MOUTH EVERY MORNING 90 tablet 1   nitroGLYCERIN (NITROSTAT) 0.4 MG SL tablet Place 1 tablet (0.4 mg total) under the tongue every 5 (five) minutes as needed for chest pain. 100 tablet 3   omeprazole (PRILOSEC) 40 MG capsule Take 1 capsule (40 mg total) by mouth daily. 90 capsule 1   oxyCODONE-acetaminophen (PERCOCET) 10-325 MG tablet Take 1 tablet by mouth every 4 (four) hours as needed for pain.      rosuvastatin (CRESTOR) 5 MG tablet TAKE 1 TABLET BY MOUTH EVERY NIGHT AT BEDTIME 90 tablet 1   VASCEPA 1 g capsule Take 2 capsules (2 g total) by mouth 2 (two) times daily. 360 capsule 1   No current facility-administered medications for this visit.    Allergies:   Patient has no known allergies.    Social History:   reports that he has been smoking cigarettes. He has a 26 pack-year smoking history. He has never used smokeless tobacco. No history on file for alcohol use and drug use.   Family History:  family history is not on file.    ROS:     Review of Systems  Constitutional: Negative.   HENT: Negative.    Eyes: Negative.   Respiratory:  Positive for shortness of breath.   Gastrointestinal: Negative.   Genitourinary: Negative.   Musculoskeletal: Negative.   Skin: Negative.   Neurological: Negative.   Endo/Heme/Allergies: Negative.   Psychiatric/Behavioral: Negative.    All other systems reviewed and are negative.     All other systems are reviewed and negative.    PHYSICAL EXAM: VS:  BP 130/72   Pulse (!) 103   Wt 152 lb 3.2 oz (69 kg)   SpO2 98%   BMI 23.14 kg/m  , BMI Body mass index is 23.14  kg/m. Last weight:  Wt Readings from Last 3 Encounters:  03/15/23 152 lb 3.2 oz (69 kg)  12/15/22 152 lb 9.6 oz (69.2 kg)  11/30/22 153 lb 4.8 oz (69.5 kg)     Physical Exam Vitals reviewed.  Constitutional:      Appearance: Normal appearance. He is normal weight.  HENT:     Head: Normocephalic.     Nose: Nose normal.     Mouth/Throat:     Mouth: Mucous membranes are moist.  Eyes:     Pupils: Pupils are equal, round, and reactive to light.  Cardiovascular:     Rate and Rhythm: Normal rate and regular rhythm.     Pulses: Normal pulses.     Heart sounds: Normal heart sounds.  Pulmonary:     Effort: Pulmonary effort is normal.  Abdominal:     General: Abdomen is flat. Bowel sounds are normal.  Musculoskeletal:        General: Normal range of motion.      Cervical back: Normal range of motion.  Skin:    General: Skin is warm.  Neurological:     General: No focal deficit present.     Mental Status: He is alert.  Psychiatric:        Mood and Affect: Mood normal.       EKG:   Recent Labs: 08/26/2022: BUN 15; Creatinine, Ser 1.21; Potassium 5.3; Sodium 138 11/27/2022: ALT 18    Lipid Panel    Component Value Date/Time   CHOL 162 11/27/2022 0957   TRIG 259 (H) 11/27/2022 0957   HDL 49 11/27/2022 0957   CHOLHDL 3.3 11/27/2022 0957   LDLCALC 71 11/27/2022 0957      Other studies Reviewed: Additional studies/ records that were reviewed today include:  Review of the above records demonstrates:       No data to display            ASSESSMENT AND PLAN:    ICD-10-CM   1. Primary hypertension  I10     2. GERD without esophagitis  K21.9     3. Prediabetes  R73.03     4. Nonrheumatic mitral valve regurgitation  I34.0     5. Mixed hyperlipidemia  E78.2     6. Coronary artery disease involving native coronary artery of native heart without angina pectoris  I25.10     7. Inappropriate sinus tachycardia (HCC)  I47.11    Says taking metoprolol    8. SOB (shortness of breath)  R06.02    stress test normal 4/24       Problem List Items Addressed This Visit       Cardiovascular and Mediastinum   Primary hypertension - Primary     Digestive   GERD without esophagitis     Other   Mixed hyperlipidemia   Prediabetes   Other Visit Diagnoses     Nonrheumatic mitral valve regurgitation       Coronary artery disease involving native coronary artery of native heart without angina pectoris       Inappropriate sinus tachycardia (HCC)       Says taking metoprolol   SOB (shortness of breath)       stress test normal 4/24          Disposition:   Return in about 2 months (around 05/16/2023).    Total time spent: 30 minutes  Signed,  Adrian Blackwater, MD  03/15/2023 11:06 AM    [facility]

## 2023-03-29 ENCOUNTER — Other Ambulatory Visit: Payer: Medicare Other

## 2023-03-29 DIAGNOSIS — N400 Enlarged prostate without lower urinary tract symptoms: Secondary | ICD-10-CM

## 2023-03-29 DIAGNOSIS — E782 Mixed hyperlipidemia: Secondary | ICD-10-CM

## 2023-03-29 DIAGNOSIS — R7303 Prediabetes: Secondary | ICD-10-CM

## 2023-03-29 DIAGNOSIS — I10 Essential (primary) hypertension: Secondary | ICD-10-CM

## 2023-03-30 LAB — CBC WITH DIFF/PLATELET
Basophils Absolute: 0 10*3/uL (ref 0.0–0.2)
Basos: 0 %
EOS (ABSOLUTE): 0.1 10*3/uL (ref 0.0–0.4)
Eos: 1 %
Hematocrit: 44.6 % (ref 37.5–51.0)
Hemoglobin: 15 g/dL (ref 13.0–17.7)
Immature Grans (Abs): 0 10*3/uL (ref 0.0–0.1)
Immature Granulocytes: 0 %
Lymphocytes Absolute: 2.7 10*3/uL (ref 0.7–3.1)
Lymphs: 30 %
MCH: 31.6 pg (ref 26.6–33.0)
MCHC: 33.6 g/dL (ref 31.5–35.7)
MCV: 94 fL (ref 79–97)
Monocytes Absolute: 0.8 10*3/uL (ref 0.1–0.9)
Monocytes: 9 %
Neutrophils Absolute: 5.5 10*3/uL (ref 1.4–7.0)
Neutrophils: 60 %
Platelets: 303 10*3/uL (ref 150–450)
RBC: 4.74 x10E6/uL (ref 4.14–5.80)
RDW: 12.5 % (ref 11.6–15.4)
WBC: 9.1 10*3/uL (ref 3.4–10.8)

## 2023-03-30 LAB — CK: Total CK: 108 U/L (ref 41–331)

## 2023-03-30 LAB — LIPID PANEL
Chol/HDL Ratio: 3.5 {ratio} (ref 0.0–5.0)
Cholesterol, Total: 165 mg/dL (ref 100–199)
HDL: 47 mg/dL (ref >39)
LDL Chol Calc (NIH): 68 mg/dL (ref 0–99)
Triglycerides: 316 mg/dL — ABNORMAL HIGH (ref 0–149)
VLDL Cholesterol Cal: 50 mg/dL — ABNORMAL HIGH (ref 5–40)

## 2023-03-30 LAB — HEMOGLOBIN A1C
Est. average glucose Bld gHb Est-mCnc: 120 mg/dL
Hgb A1c MFr Bld: 5.8 % — ABNORMAL HIGH (ref 4.8–5.6)

## 2023-03-30 LAB — COMPREHENSIVE METABOLIC PANEL
ALT: 18 [IU]/L (ref 0–44)
AST: 37 [IU]/L (ref 0–40)
Albumin: 4.5 g/dL (ref 3.8–4.9)
Alkaline Phosphatase: 78 [IU]/L (ref 44–121)
BUN/Creatinine Ratio: 12 (ref 9–20)
BUN: 13 mg/dL (ref 6–24)
Bilirubin Total: 0.2 mg/dL (ref 0.0–1.2)
CO2: 23 mmol/L (ref 20–29)
Calcium: 9.8 mg/dL (ref 8.7–10.2)
Chloride: 100 mmol/L (ref 96–106)
Creatinine, Ser: 1.09 mg/dL (ref 0.76–1.27)
Globulin, Total: 2.7 g/dL (ref 1.5–4.5)
Glucose: 116 mg/dL — ABNORMAL HIGH (ref 70–99)
Potassium: 4.9 mmol/L (ref 3.5–5.2)
Sodium: 140 mmol/L (ref 134–144)
Total Protein: 7.2 g/dL (ref 6.0–8.5)
eGFR: 82 mL/min/{1.73_m2} (ref >59)

## 2023-03-30 LAB — HEPATIC FUNCTION PANEL: Bilirubin, Direct: 0.13 mg/dL (ref 0.00–0.40)

## 2023-03-30 LAB — PSA: Prostate Specific Ag, Serum: 0.5 ng/mL (ref 0.0–4.0)

## 2023-04-02 ENCOUNTER — Other Ambulatory Visit: Payer: Self-pay | Admitting: Internal Medicine

## 2023-04-02 ENCOUNTER — Other Ambulatory Visit: Payer: Self-pay | Admitting: Cardiovascular Disease

## 2023-04-06 ENCOUNTER — Ambulatory Visit (INDEPENDENT_AMBULATORY_CARE_PROVIDER_SITE_OTHER): Payer: Medicare Other | Admitting: Internal Medicine

## 2023-04-06 VITALS — BP 120/80 | HR 102 | Ht 68.0 in | Wt 149.4 lb

## 2023-04-06 DIAGNOSIS — E782 Mixed hyperlipidemia: Secondary | ICD-10-CM | POA: Diagnosis not present

## 2023-04-06 DIAGNOSIS — Z1331 Encounter for screening for depression: Secondary | ICD-10-CM | POA: Diagnosis not present

## 2023-04-06 DIAGNOSIS — Z0001 Encounter for general adult medical examination with abnormal findings: Secondary | ICD-10-CM

## 2023-04-06 DIAGNOSIS — I10 Essential (primary) hypertension: Secondary | ICD-10-CM

## 2023-04-06 DIAGNOSIS — R7303 Prediabetes: Secondary | ICD-10-CM | POA: Diagnosis not present

## 2023-04-06 DIAGNOSIS — Z1211 Encounter for screening for malignant neoplasm of colon: Secondary | ICD-10-CM

## 2023-04-06 MED ORDER — ROSUVASTATIN CALCIUM 5 MG PO TABS: 5.0000 mg | ORAL_TABLET | Freq: Every day | ORAL | 1 refills | Status: DC

## 2023-04-06 NOTE — Progress Notes (Signed)
 Established Patient Office Visit  Subjective:  Patient ID: Jesse Nelson, male    DOB: 02-08-1971  Age: 52 y.o. MRN: [id number]  Chief Complaint  Patient presents with   Annual Exam    CPE    No new complaints, here for CPE, lab review and medication refills.LDL and TC well controlled on lab review. Triglycerides deteriorated however and remain high. PSA, CBC unremarkable and cmp notable for elevated fasting glucose.    No other concerns at this time.   Past Medical History:  Diagnosis Date   Asthma    GERD (gastroesophageal reflux disease)    Hyperlipidemia    Hypertension     No past surgical history on file.  Social History   Socioeconomic History   Marital status: Married    Spouse name: Not on file   Number of children: Not on file   Years of education: Not on file   Highest education level: Not on file  Occupational History   Not on file  Tobacco Use   Smoking status: Every Day    Current packs/day: 1.00    Average packs/day: 1 pack/day for 26.0 years (26.0 ttl pk-yrs)    Types: Cigarettes   Smokeless tobacco: Never  Substance and Sexual Activity   Alcohol use: Not on file   Drug use: Not on file   Sexual activity: Not on file  Other Topics Concern   Not on file  Social History Narrative   Not on file   Social Drivers of Health   Financial Resource Strain: Not on file  Food Insecurity: Not on file  Transportation Needs: Not on file  Physical Activity: Not on file  Stress: Not on file  Social Connections: Not on file  Intimate Partner Violence: Not on file    No family history on file.  No Known Allergies  Outpatient Medications Prior to Visit  Medication Sig   albuterol  (VENTOLIN  HFA) 108 (90 Base) MCG/ACT inhaler INHALE 1 PUFF EVERY 6 HOURS AS NEEDED   amLODipine  (NORVASC ) 5 MG tablet Take 1 tablet (5 mg total) by mouth every morning.   clopidogrel  (PLAVIX ) 75 MG tablet TAKE 1 TABLET(75 MG) BY MOUTH DAILY   fluticasone  (FLONASE ) 50  MCG/ACT nasal spray Place 2 sprays into both nostrils daily.   lisinopril -hydrochlorothiazide  (ZESTORETIC ) 20-12.5 MG tablet TAKE 2 TABLETS BY MOUTH DAILY   metoprolol  (TOPROL  XL) 200 MG 24 hr tablet Take 1 tablet (200 mg total) by mouth daily.   montelukast  (SINGULAIR ) 10 MG tablet TAKE 1 TABLET BY MOUTH EVERY MORNING   nitroGLYCERIN  (NITROSTAT ) 0.4 MG SL tablet Place 1 tablet (0.4 mg total) under the tongue every 5 (five) minutes as needed for chest pain.   omeprazole  (PRILOSEC) 40 MG capsule Take 1 capsule (40 mg total) by mouth daily.   oxyCODONE -acetaminophen  (PERCOCET) 10-325 MG tablet Take 1 tablet by mouth every 4 (four) hours as needed for pain.   rosuvastatin  (CRESTOR ) 5 MG tablet TAKE 1 TABLET BY MOUTH EVERY NIGHT AT BEDTIME   fluticasone -salmeterol (ADVAIR HFA) 115-21 MCG/ACT inhaler Inhale 2 puffs into the lungs 2 (two) times daily. (Patient not taking: Reported on 04/06/2023)   [DISCONTINUED] VASCEPA  1 g capsule Take 2 capsules (2 g total) by mouth 2 (two) times daily.   No facility-administered medications prior to visit.    Review of Systems  Constitutional:  Positive for weight loss.  HENT: Negative.    Eyes: Negative.   Respiratory: Negative.    Cardiovascular: Negative.   Gastrointestinal:  Negative.   Genitourinary: Negative.   Musculoskeletal:  Positive for back pain and joint pain.       Chronic pain  Skin: Negative.   Neurological: Negative.   Endo/Heme/Allergies: Negative.   Psychiatric/Behavioral: Negative.         Objective:   BP 120/80   Pulse (!) 102   Ht 5' 8 (1.727 m)   Wt 149 lb 6.4 oz (67.8 kg)   SpO2 97%   BMI 22.72 kg/m   Vitals:   04/06/23 1150  BP: 120/80  Pulse: (!) 102  Height: 5' 8 (1.727 m)  Weight: 149 lb 6.4 oz (67.8 kg)  SpO2: 97%  BMI (Calculated): 22.72    Physical Exam Vitals and nursing note reviewed.  Constitutional:      Appearance: Normal appearance.  HENT:     Head: Normocephalic.     Left Ear: There is no  impacted cerumen.     Nose: Nose normal.     Mouth/Throat:     Mouth: Mucous membranes are moist.     Pharynx: No posterior oropharyngeal erythema.  Eyes:     Extraocular Movements: Extraocular movements intact.     Pupils: Pupils are equal, round, and reactive to light.  Cardiovascular:     Rate and Rhythm: Regular rhythm.     Chest Wall: PMI is not displaced.     Pulses: Normal pulses.     Heart sounds: Normal heart sounds. No murmur heard. Pulmonary:     Effort: Pulmonary effort is normal.     Breath sounds: Normal air entry. No rhonchi or rales.  Abdominal:     General: Abdomen is flat. Bowel sounds are normal. There is no distension.     Palpations: Abdomen is soft. There is no hepatomegaly, splenomegaly or mass.     Tenderness: There is no abdominal tenderness.  Musculoskeletal:        General: Normal range of motion.     Cervical back: Normal range of motion and neck supple.     Right lower leg: No edema.     Left lower leg: No edema.  Skin:    General: Skin is warm and dry.  Neurological:     General: No focal deficit present.     Mental Status: He is alert and oriented to person, place, and time.     Cranial Nerves: No cranial nerve deficit.     Motor: No weakness.  Psychiatric:        Mood and Affect: Mood normal.        Behavior: Behavior normal.      No results found for any visits on 04/06/23.  Recent Results (from the past 2160 hours)  CK     Status: None   Collection Time: 03/29/23  8:51 AM  Result Value Ref Range   Total CK 108 41 - 331 U/L  Hepatic function panel     Status: None   Collection Time: 03/29/23  8:51 AM  Result Value Ref Range   Bilirubin, Direct 0.13 0.00 - 0.40 mg/dL  Lipid panel     Status: Abnormal   Collection Time: 03/29/23  8:51 AM  Result Value Ref Range   Cholesterol, Total 165 100 - 199 mg/dL   Triglycerides 683 (H) 0 - 149 mg/dL   HDL 47 >60 mg/dL   VLDL Cholesterol Cal 50 (H) 5 - 40 mg/dL   LDL Chol Calc (NIH) 68 0 -  99 mg/dL   Chol/HDL Ratio 3.5 0.0 -  5.0 ratio    Comment:                                   T. Chol/HDL Ratio                                             Men  Women                               1/2 Avg.Risk  3.4    3.3                                   Avg.Risk  5.0    4.4                                2X Avg.Risk  9.6    7.1                                3X Avg.Risk 23.4   11.0   CBC With Diff/Platelet     Status: None   Collection Time: 03/29/23  8:51 AM  Result Value Ref Range   WBC 9.1 3.4 - 10.8 x10E3/uL   RBC 4.74 4.14 - 5.80 x10E6/uL   Hemoglobin 15.0 13.0 - 17.7 g/dL   Hematocrit 55.3 62.4 - 51.0 %   MCV 94 79 - 97 fL   MCH 31.6 26.6 - 33.0 pg   MCHC 33.6 31.5 - 35.7 g/dL   RDW 87.4 88.3 - 84.5 %   Platelets 303 150 - 450 x10E3/uL   Neutrophils 60 Not Estab. %   Lymphs 30 Not Estab. %   Monocytes 9 Not Estab. %   Eos 1 Not Estab. %   Basos 0 Not Estab. %   Neutrophils Absolute 5.5 1.4 - 7.0 x10E3/uL   Lymphocytes Absolute 2.7 0.7 - 3.1 x10E3/uL   Monocytes Absolute 0.8 0.1 - 0.9 x10E3/uL   EOS (ABSOLUTE) 0.1 0.0 - 0.4 x10E3/uL   Basophils Absolute 0.0 0.0 - 0.2 x10E3/uL   Immature Granulocytes 0 Not Estab. %   Immature Grans (Abs) 0.0 0.0 - 0.1 x10E3/uL  Comprehensive metabolic panel     Status: Abnormal   Collection Time: 03/29/23  8:51 AM  Result Value Ref Range   Glucose 116 (H) 70 - 99 mg/dL   BUN 13 6 - 24 mg/dL   Creatinine, Ser 8.90 0.76 - 1.27 mg/dL   eGFR 82 >40 fO/fpw/8.26   BUN/Creatinine Ratio 12 9 - 20   Sodium 140 134 - 144 mmol/L   Potassium 4.9 3.5 - 5.2 mmol/L   Chloride 100 96 - 106 mmol/L   CO2 23 20 - 29 mmol/L   Calcium  9.8 8.7 - 10.2 mg/dL   Total Protein 7.2 6.0 - 8.5 g/dL   Albumin 4.5 3.8 - 4.9 g/dL   Globulin, Total 2.7 1.5 - 4.5 g/dL   Bilirubin Total 0.2 0.0 - 1.2 mg/dL   Alkaline Phosphatase 78 44 - 121 IU/L   AST 37 0 - 40 IU/L   ALT 18 0 -  44 IU/L  PSA     Status: None   Collection Time: 03/29/23  8:51 AM  Result Value  Ref Range   Prostate Specific Ag, Serum 0.5 0.0 - 4.0 ng/mL    Comment: Roche ECLIA methodology. According to the American Urological Association, Serum PSA should decrease and remain at undetectable levels after radical prostatectomy. The AUA defines biochemical recurrence as an initial PSA value 0.2 ng/mL or greater followed by a subsequent confirmatory PSA value 0.2 ng/mL or greater. Values obtained with different assay methods or kits cannot be used interchangeably. Results cannot be interpreted as absolute evidence of the presence or absence of malignant disease.   Hemoglobin A1c     Status: Abnormal   Collection Time: 03/29/23  8:51 AM  Result Value Ref Range   Hgb A1c MFr Bld 5.8 (H) 4.8 - 5.6 %    Comment:          Prediabetes: 5.7 - 6.4          Diabetes: >6.4          Glycemic control for adults with diabetes: <7.0    Est. average glucose Bld gHb Est-mCnc 120 mg/dL      Assessment & Plan:  As per problem list  Problem List Items Addressed This Visit       Cardiovascular and Mediastinum   Primary hypertension     Other   Mixed hyperlipidemia   Prediabetes   Other Visit Diagnoses       Colon cancer screening    -  Primary       Return in about 3 months (around 07/05/2023) for fu with labs prior.   Total time spent: 30 minutes  Sherrill Cinderella Perry, MD  04/06/2023   This document may have been prepared by [facility] Voice Recognition software and as such may include unintentional dictation errors.

## 2023-04-08 ENCOUNTER — Encounter: Payer: Self-pay | Admitting: Internal Medicine

## 2023-05-17 ENCOUNTER — Other Ambulatory Visit: Payer: Self-pay | Admitting: Cardiovascular Disease

## 2023-05-17 ENCOUNTER — Encounter: Payer: Self-pay | Admitting: Cardiovascular Disease

## 2023-05-17 ENCOUNTER — Ambulatory Visit (INDEPENDENT_AMBULATORY_CARE_PROVIDER_SITE_OTHER): Payer: Medicare Other | Admitting: Cardiovascular Disease

## 2023-05-17 VITALS — BP 128/82 | HR 92 | Ht 68.0 in | Wt 148.0 lb

## 2023-05-17 DIAGNOSIS — E782 Mixed hyperlipidemia: Secondary | ICD-10-CM

## 2023-05-17 DIAGNOSIS — I340 Nonrheumatic mitral (valve) insufficiency: Secondary | ICD-10-CM | POA: Diagnosis not present

## 2023-05-17 DIAGNOSIS — I10 Essential (primary) hypertension: Secondary | ICD-10-CM | POA: Diagnosis not present

## 2023-05-17 DIAGNOSIS — R7303 Prediabetes: Secondary | ICD-10-CM

## 2023-05-17 DIAGNOSIS — I2510 Atherosclerotic heart disease of native coronary artery without angina pectoris: Secondary | ICD-10-CM

## 2023-05-17 DIAGNOSIS — I4711 Inappropriate sinus tachycardia, so stated: Secondary | ICD-10-CM

## 2023-05-17 DIAGNOSIS — K219 Gastro-esophageal reflux disease without esophagitis: Secondary | ICD-10-CM

## 2023-05-17 DIAGNOSIS — R0602 Shortness of breath: Secondary | ICD-10-CM

## 2023-05-17 MED ORDER — FAMOTIDINE 20 MG PO TABS: 20.0000 mg | ORAL_TABLET | Freq: Two times a day (BID) | ORAL | 1 refills | Status: DC

## 2023-05-17 MED ORDER — CLOPIDOGREL BISULFATE 75 MG PO TABS: 75.0000 mg | ORAL_TABLET | Freq: Once | ORAL | 4 refills | Status: AC

## 2023-05-17 MED ORDER — METOPROLOL SUCCINATE ER 200 MG PO TB24: 200.0000 mg | ORAL_TABLET | Freq: Every day | ORAL | 3 refills | Status: AC

## 2023-05-17 NOTE — Progress Notes (Signed)
 Cardiology Office Note   Date:  05/17/2023   ID:  Jesse Nelson, DOB [birth year], MRN [id number]  PCP:  Shari Daughters, MD  Cardiologist:  Debborah Fairly, MD      History of Present Illness: Jesse Nelson is a 53 y.o. male who presents for  Chief Complaint  Patient presents with   Follow-up    2 month follow up    Doing well      Past Medical History:  Diagnosis Date   Asthma    GERD (gastroesophageal reflux disease)    Hyperlipidemia    Hypertension      History reviewed. No pertinent surgical history.   Current Outpatient Medications  Medication Sig Dispense Refill   famotidine  (PEPCID ) 20 MG tablet Take 1 tablet (20 mg total) by mouth 2 (two) times daily. 60 tablet 1   albuterol  (VENTOLIN  HFA) 108 (90 Base) MCG/ACT inhaler INHALE 1 PUFF EVERY 6 HOURS AS NEEDED 18 g 5   amLODipine  (NORVASC ) 5 MG tablet Take 1 tablet (5 mg total) by mouth every morning. 90 tablet 2   clopidogrel  (PLAVIX ) 75 MG tablet Take 1 tablet (75 mg total) by mouth once for 1 dose. 30 tablet 4   fluticasone (FLONASE) 50 MCG/ACT nasal spray Place 2 sprays into both nostrils daily.     fluticasone-salmeterol (ADVAIR HFA) 115-21 MCG/ACT inhaler Inhale 2 puffs into the lungs 2 (two) times daily. (Patient not taking: Reported on 04/06/2023)     lisinopril -hydrochlorothiazide  (ZESTORETIC ) 20-12.5 MG tablet TAKE 2 TABLETS BY MOUTH DAILY 180 tablet 0   metoprolol  (TOPROL  XL) 200 MG 24 hr tablet Take 1 tablet (200 mg total) by mouth daily. 90 tablet 3   montelukast (SINGULAIR) 10 MG tablet TAKE 1 TABLET BY MOUTH EVERY MORNING 90 tablet 1   nitroGLYCERIN  (NITROSTAT ) 0.4 MG SL tablet Place 1 tablet (0.4 mg total) under the tongue every 5 (five) minutes as needed for chest pain. 100 tablet 3   oxyCODONE-acetaminophen (PERCOCET) 10-325 MG tablet Take 1 tablet by mouth every 4 (four) hours as needed for pain.     rosuvastatin  (CRESTOR ) 5 MG tablet Take 1 tablet (5 mg total) by mouth at bedtime. 90  tablet 1   No current facility-administered medications for this visit.    Allergies:   Patient has no known allergies.    Social History:   reports that he has been smoking cigarettes. He has a 26 pack-year smoking history. He has never used smokeless tobacco. No history on file for alcohol use and drug use.   Family History:  family history is not on file.    ROS:     Review of Systems  Constitutional: Negative.   HENT: Negative.    Eyes: Negative.   Respiratory: Negative.    Gastrointestinal: Negative.   Genitourinary: Negative.   Musculoskeletal: Negative.   Skin: Negative.   Neurological: Negative.   Endo/Heme/Allergies: Negative.   Psychiatric/Behavioral: Negative.    All other systems reviewed and are negative.     All other systems are reviewed and negative.    PHYSICAL EXAM: VS:  BP 128/82   Pulse 92   Ht 5\' 8"  (1.727 m)   Wt 148 lb (67.1 kg)   PF 97 L/min   BMI 22.50 kg/m  , BMI Body mass index is 22.5 kg/m. Last weight:  Wt Readings from Last 3 Encounters:  05/17/23 148 lb (67.1 kg)  04/06/23 149 lb 6.4 oz (67.8 kg)  03/15/23 152 lb 3.2  oz (69 kg)     Physical Exam Vitals reviewed.  Constitutional:      Appearance: Normal appearance. He is normal weight.  HENT:     Head: Normocephalic.     Nose: Nose normal.     Mouth/Throat:     Mouth: Mucous membranes are moist.  Eyes:     Pupils: Pupils are equal, round, and reactive to light.  Cardiovascular:     Rate and Rhythm: Normal rate and regular rhythm.     Pulses: Normal pulses.     Heart sounds: Normal heart sounds.  Pulmonary:     Effort: Pulmonary effort is normal.  Abdominal:     General: Abdomen is flat. Bowel sounds are normal.  Musculoskeletal:        General: Normal range of motion.     Cervical back: Normal range of motion.  Skin:    General: Skin is warm.  Neurological:     General: No focal deficit present.     Mental Status: He is alert.  Psychiatric:        Mood and  Affect: Mood normal.       EKG:   Recent Labs: 03/29/2023: ALT 18; BUN 13; Creatinine, Ser 1.09; Hemoglobin 15.0; Platelets 303; Potassium 4.9; Sodium 140    Lipid Panel    Component Value Date/Time   CHOL 165 03/29/2023 0851   TRIG 316 (H) 03/29/2023 0851   HDL 47 03/29/2023 0851   CHOLHDL 3.5 03/29/2023 0851   LDLCALC 68 03/29/2023 0851      Other studies Reviewed: Additional studies/ records that were reviewed today include:  Review of the above records demonstrates:       No data to display            ASSESSMENT AND PLAN:    ICD-10-CM   1. Primary hypertension  I10 metoprolol  (TOPROL  XL) 200 MG 24 hr tablet    clopidogrel  (PLAVIX ) 75 MG tablet    famotidine  (PEPCID ) 20 MG tablet    2. Mixed hyperlipidemia  E78.2 metoprolol  (TOPROL  XL) 200 MG 24 hr tablet    clopidogrel  (PLAVIX ) 75 MG tablet    famotidine  (PEPCID ) 20 MG tablet    3. Coronary artery disease involving native coronary artery of native heart without angina pectoris  I25.10 clopidogrel  (PLAVIX ) 75 MG tablet    famotidine  (PEPCID ) 20 MG tablet    4. Nonrheumatic mitral valve regurgitation  I34.0 metoprolol  (TOPROL  XL) 200 MG 24 hr tablet    clopidogrel  (PLAVIX ) 75 MG tablet    famotidine  (PEPCID ) 20 MG tablet    5. SOB (shortness of breath)  R06.02 metoprolol  (TOPROL  XL) 200 MG 24 hr tablet    clopidogrel  (PLAVIX ) 75 MG tablet    famotidine  (PEPCID ) 20 MG tablet   Improved    6. Prediabetes  R73.03 metoprolol  (TOPROL  XL) 200 MG 24 hr tablet    clopidogrel  (PLAVIX ) 75 MG tablet    famotidine  (PEPCID ) 20 MG tablet    7. SOB (shortness of breath)  R06.02 metoprolol  (TOPROL  XL) 200 MG 24 hr tablet    clopidogrel  (PLAVIX ) 75 MG tablet    famotidine  (PEPCID ) 20 MG tablet   will do stress test    8. GERD without esophagitis  K21.9 metoprolol  (TOPROL  XL) 200 MG 24 hr tablet    clopidogrel  (PLAVIX ) 75 MG tablet    famotidine  (PEPCID ) 20 MG tablet    9. Inappropriate sinus tachycardia (HCC)   I47.11 metoprolol  (TOPROL  XL) 200 MG 24 hr  tablet    clopidogrel  (PLAVIX ) 75 MG tablet    famotidine  (PEPCID ) 20 MG tablet   heart rate over 100, will increase to metorolol200 daily    10. SOB (shortness of breath)  R06.02 metoprolol  (TOPROL  XL) 200 MG 24 hr tablet    clopidogrel  (PLAVIX ) 75 MG tablet    famotidine  (PEPCID ) 20 MG tablet       Problem List Items Addressed This Visit       Cardiovascular and Mediastinum   Primary hypertension - Primary   Relevant Medications   metoprolol  (TOPROL  XL) 200 MG 24 hr tablet   clopidogrel  (PLAVIX ) 75 MG tablet   famotidine  (PEPCID ) 20 MG tablet     Digestive   GERD without esophagitis   Relevant Medications   metoprolol  (TOPROL  XL) 200 MG 24 hr tablet   clopidogrel  (PLAVIX ) 75 MG tablet   famotidine  (PEPCID ) 20 MG tablet     Other   Mixed hyperlipidemia   Relevant Medications   metoprolol  (TOPROL  XL) 200 MG 24 hr tablet   clopidogrel  (PLAVIX ) 75 MG tablet   famotidine  (PEPCID ) 20 MG tablet   Prediabetes   Relevant Medications   metoprolol  (TOPROL  XL) 200 MG 24 hr tablet   clopidogrel  (PLAVIX ) 75 MG tablet   famotidine  (PEPCID ) 20 MG tablet   Other Visit Diagnoses       Coronary artery disease involving native coronary artery of native heart without angina pectoris       Relevant Medications   metoprolol  (TOPROL  XL) 200 MG 24 hr tablet   clopidogrel  (PLAVIX ) 75 MG tablet   famotidine  (PEPCID ) 20 MG tablet     Nonrheumatic mitral valve regurgitation       Relevant Medications   metoprolol  (TOPROL  XL) 200 MG 24 hr tablet   clopidogrel  (PLAVIX ) 75 MG tablet   famotidine  (PEPCID ) 20 MG tablet     SOB (shortness of breath)       Improved   Relevant Medications   metoprolol  (TOPROL  XL) 200 MG 24 hr tablet   clopidogrel  (PLAVIX ) 75 MG tablet   famotidine  (PEPCID ) 20 MG tablet     SOB (shortness of breath)       will do stress test   Relevant Medications   metoprolol  (TOPROL  XL) 200 MG 24 hr tablet   clopidogrel  (PLAVIX )  75 MG tablet   famotidine  (PEPCID ) 20 MG tablet     Inappropriate sinus tachycardia (HCC)       heart rate over 100, will increase to metorolol200 daily   Relevant Medications   metoprolol  (TOPROL  XL) 200 MG 24 hr tablet   clopidogrel  (PLAVIX ) 75 MG tablet   famotidine  (PEPCID ) 20 MG tablet     SOB (shortness of breath)       Relevant Medications   metoprolol  (TOPROL  XL) 200 MG 24 hr tablet   clopidogrel  (PLAVIX ) 75 MG tablet   famotidine  (PEPCID ) 20 MG tablet          Disposition:   Return in about 3 months (around 08/14/2023).    Total time spent: 30 minutes  Signed,  Debborah Fairly, MD  05/17/2023 11:25 AM    [facility]

## 2023-06-02 ENCOUNTER — Other Ambulatory Visit: Payer: Self-pay | Admitting: Cardiovascular Disease

## 2023-06-30 ENCOUNTER — Other Ambulatory Visit: Payer: Self-pay | Admitting: Cardiovascular Disease

## 2023-07-12 ENCOUNTER — Ambulatory Visit: Payer: Medicare Other | Admitting: Internal Medicine

## 2023-07-19 ENCOUNTER — Other Ambulatory Visit: Payer: Self-pay

## 2023-07-19 DIAGNOSIS — K219 Gastro-esophageal reflux disease without esophagitis: Secondary | ICD-10-CM

## 2023-07-19 DIAGNOSIS — R0602 Shortness of breath: Secondary | ICD-10-CM

## 2023-07-19 DIAGNOSIS — I2510 Atherosclerotic heart disease of native coronary artery without angina pectoris: Secondary | ICD-10-CM

## 2023-07-19 DIAGNOSIS — E782 Mixed hyperlipidemia: Secondary | ICD-10-CM

## 2023-07-19 DIAGNOSIS — R7303 Prediabetes: Secondary | ICD-10-CM

## 2023-07-19 DIAGNOSIS — I340 Nonrheumatic mitral (valve) insufficiency: Secondary | ICD-10-CM

## 2023-07-19 DIAGNOSIS — I10 Essential (primary) hypertension: Secondary | ICD-10-CM

## 2023-07-19 DIAGNOSIS — I4711 Inappropriate sinus tachycardia, so stated: Secondary | ICD-10-CM

## 2023-07-20 MED ORDER — FAMOTIDINE 20 MG PO TABS: 20.0000 mg | ORAL_TABLET | Freq: Two times a day (BID) | ORAL | 1 refills | Status: DC

## 2023-07-26 ENCOUNTER — Ambulatory Visit: Payer: Medicare Other | Admitting: Internal Medicine

## 2023-08-07 ENCOUNTER — Other Ambulatory Visit: Payer: Self-pay | Admitting: Internal Medicine

## 2023-08-07 DIAGNOSIS — E782 Mixed hyperlipidemia: Secondary | ICD-10-CM

## 2023-08-16 ENCOUNTER — Other Ambulatory Visit

## 2023-08-16 DIAGNOSIS — I10 Essential (primary) hypertension: Secondary | ICD-10-CM

## 2023-08-16 DIAGNOSIS — E782 Mixed hyperlipidemia: Secondary | ICD-10-CM

## 2023-08-16 DIAGNOSIS — R7303 Prediabetes: Secondary | ICD-10-CM

## 2023-08-17 LAB — CBC WITH DIFFERENTIAL/PLATELET
Basophils Absolute: 0 10*3/uL (ref 0.0–0.2)
Basos: 0 %
EOS (ABSOLUTE): 0.1 10*3/uL (ref 0.0–0.4)
Eos: 1 %
Hematocrit: 42.7 % (ref 37.5–51.0)
Hemoglobin: 13.9 g/dL (ref 13.0–17.7)
Immature Grans (Abs): 0 10*3/uL (ref 0.0–0.1)
Immature Granulocytes: 0 %
Lymphocytes Absolute: 2.5 10*3/uL (ref 0.7–3.1)
Lymphs: 27 %
MCH: 30.8 pg (ref 26.6–33.0)
MCHC: 32.6 g/dL (ref 31.5–35.7)
MCV: 95 fL (ref 79–97)
Monocytes Absolute: 0.9 10*3/uL (ref 0.1–0.9)
Monocytes: 9 %
Neutrophils Absolute: 5.8 10*3/uL (ref 1.4–7.0)
Neutrophils: 63 %
Platelets: 286 10*3/uL (ref 150–450)
RBC: 4.51 x10E6/uL (ref 4.14–5.80)
RDW: 12.7 % (ref 11.6–15.4)
WBC: 9.3 10*3/uL (ref 3.4–10.8)

## 2023-08-17 LAB — CMP14+EGFR
ALT: 13 IU/L (ref 0–44)
AST: 32 IU/L (ref 0–40)
Albumin: 4.4 g/dL (ref 3.8–4.9)
Alkaline Phosphatase: 69 IU/L (ref 44–121)
BUN/Creatinine Ratio: 13 (ref 9–20)
BUN: 13 mg/dL (ref 6–24)
Bilirubin Total: 0.3 mg/dL (ref 0.0–1.2)
CO2: 22 mmol/L (ref 20–29)
Calcium: 10 mg/dL (ref 8.7–10.2)
Chloride: 101 mmol/L (ref 96–106)
Creatinine, Ser: 1.01 mg/dL (ref 0.76–1.27)
Globulin, Total: 2.5 g/dL (ref 1.5–4.5)
Glucose: 126 mg/dL — ABNORMAL HIGH (ref 70–99)
Potassium: 4.7 mmol/L (ref 3.5–5.2)
Sodium: 140 mmol/L (ref 134–144)
Total Protein: 6.9 g/dL (ref 6.0–8.5)
eGFR: 89 mL/min/{1.73_m2} (ref >59)

## 2023-08-17 LAB — HEPATIC FUNCTION PANEL
ALT: 15 IU/L (ref 0–44)
AST: 31 IU/L (ref 0–40)
Albumin: 4.6 g/dL (ref 3.8–4.9)
Alkaline Phosphatase: 70 IU/L (ref 44–121)
Bilirubin Total: 0.3 mg/dL (ref 0.0–1.2)
Bilirubin, Direct: 0.13 mg/dL (ref 0.00–0.40)
Total Protein: 7 g/dL (ref 6.0–8.5)

## 2023-08-17 LAB — HEMOGLOBIN A1C
Est. average glucose Bld gHb Est-mCnc: 120 mg/dL
Hgb A1c MFr Bld: 5.8 % — ABNORMAL HIGH (ref 4.8–5.6)

## 2023-08-17 LAB — LIPID PANEL
Chol/HDL Ratio: 5.2 ratio — ABNORMAL HIGH (ref 0.0–5.0)
Cholesterol, Total: 223 mg/dL — ABNORMAL HIGH (ref 100–199)
HDL: 43 mg/dL (ref >39)
LDL Chol Calc (NIH): 123 mg/dL — ABNORMAL HIGH (ref 0–99)
Triglycerides: 323 mg/dL — ABNORMAL HIGH (ref 0–149)
VLDL Cholesterol Cal: 57 mg/dL — ABNORMAL HIGH (ref 5–40)

## 2023-08-18 ENCOUNTER — Encounter: Payer: Self-pay | Admitting: Internal Medicine

## 2023-08-18 ENCOUNTER — Ambulatory Visit: Payer: Self-pay | Admitting: Internal Medicine

## 2023-08-18 ENCOUNTER — Ambulatory Visit (INDEPENDENT_AMBULATORY_CARE_PROVIDER_SITE_OTHER): Admitting: Internal Medicine

## 2023-08-18 VITALS — BP 122/80 | HR 93 | Temp 97.7°F | Ht 68.0 in | Wt 147.6 lb

## 2023-08-18 DIAGNOSIS — I10 Essential (primary) hypertension: Secondary | ICD-10-CM

## 2023-08-18 DIAGNOSIS — E782 Mixed hyperlipidemia: Secondary | ICD-10-CM

## 2023-08-18 DIAGNOSIS — R7303 Prediabetes: Secondary | ICD-10-CM | POA: Diagnosis not present

## 2023-08-18 MED ORDER — ROSUVASTATIN CALCIUM 10 MG PO TABS: 10.0000 mg | ORAL_TABLET | Freq: Every day | ORAL | 1 refills | Status: DC

## 2023-08-18 NOTE — Progress Notes (Signed)
 Established Patient Office Visit  Subjective:  Patient ID: Jesse Nelson, male    DOB: [birth year]  Age: 53 y.o. MRN: [id number]  Chief Complaint  Patient presents with   Follow-up    3 month lab results    No new complaints, here for lab review and medication refills. Labs reviewed and notable for deterioration in lipids to which he admits dietary indiscretion and missing a few days of rosuvastatin .    No other concerns at this time.   Past Medical History:  Diagnosis Date   Asthma    GERD (gastroesophageal reflux disease)    Hyperlipidemia    Hypertension     No past surgical history on file.  Social History   Socioeconomic History   Marital status: Married    Spouse name: Not on file   Number of children: Not on file   Years of education: Not on file   Highest education level: Not on file  Occupational History   Not on file  Tobacco Use   Smoking status: Every Day    Current packs/day: 1.00    Average packs/day: 1 pack/day for 26.0 years (26.0 ttl pk-yrs)    Types: Cigarettes   Smokeless tobacco: Never  Substance and Sexual Activity   Alcohol use: Not on file   Drug use: Not on file   Sexual activity: Not on file  Other Topics Concern   Not on file  Social History Narrative   Not on file   Social Drivers of Health   Financial Resource Strain: Medium Risk (08/11/2023)   Received from [facility]   Overall Financial Resource Strain (CARDIA)    Difficulty of Paying Living Expenses: Somewhat hard  Food Insecurity: No Food Insecurity (08/11/2023)   Received from [facility]   Hunger Vital Sign    Worried About Running Out of Food in the Last Year: Never true    Ran Out of Food in the Last Year: Never true  Transportation Needs: No Transportation Needs (08/11/2023)   Received from [facility] - Transportation    In the past 12 months, has lack of transportation kept you from medical  appointments or from getting medications?: No    Lack of Transportation (Non-Medical): No  Physical Activity: Not on file  Stress: Not on file  Social Connections: Not on file  Intimate Partner Violence: Not on file    No family history on file.  No Known Allergies  Outpatient Medications Prior to Visit  Medication Sig   albuterol  (VENTOLIN  HFA) 108 (90 Base) MCG/ACT inhaler INHALE 1 PUFF EVERY 6 HOURS AS NEEDED   amLODipine  (NORVASC ) 5 MG tablet TAKE 1 TABLET(5 MG) BY MOUTH EVERY MORNING   famotidine  (PEPCID ) 20 MG tablet Take 1 tablet (20 mg total) by mouth 2 (two) times daily.   fluticasone (FLONASE) 50 MCG/ACT nasal spray Place 2 sprays into both nostrils daily.   lisinopril -hydrochlorothiazide  (ZESTORETIC ) 20-12.5 MG tablet TAKE 2 TABLETS BY MOUTH DAILY   metoprolol  (TOPROL  XL) 200 MG 24 hr tablet Take 1 tablet (200 mg total) by mouth daily.   montelukast (SINGULAIR) 10 MG tablet TAKE 1 TABLET BY MOUTH EVERY MORNING   nitroGLYCERIN  (NITROSTAT ) 0.4 MG SL tablet Place 1 tablet (0.4 mg total) under the tongue every 5 (five) minutes as needed for chest pain.   oxyCODONE-acetaminophen (PERCOCET) 10-325 MG tablet Take 1 tablet by mouth every 4 (four) hours as needed for pain.   [DISCONTINUED]  rosuvastatin  (CRESTOR ) 5 MG tablet TAKE 1 TABLET(5 MG) BY MOUTH AT BEDTIME   fluticasone-salmeterol (ADVAIR HFA) 115-21 MCG/ACT inhaler Inhale 2 puffs into the lungs 2 (two) times daily. (Patient not taking: Reported on 05/22/2022)   No facility-administered medications prior to visit.    Review of Systems  HENT: Negative.    Eyes: Negative.   Respiratory: Negative.    Cardiovascular: Negative.   Gastrointestinal: Negative.   Genitourinary: Negative.   Musculoskeletal:  Positive for back pain and joint pain.       Chronic pain  Skin: Negative.   Neurological: Negative.   Endo/Heme/Allergies: Negative.   Psychiatric/Behavioral: Negative.         Objective:   BP 122/80   Pulse 93    Temp 97.7 F (36.5 C)   Ht 5\' 8"  (1.727 m)   Wt 147 lb 9.6 oz (67 kg)   SpO2 96%   BMI 22.44 kg/m   Vitals:   08/18/23 1141  BP: 122/80  Pulse: 93  Temp: 97.7 F (36.5 C)  Height: 5\' 8"  (1.727 m)  Weight: 147 lb 9.6 oz (67 kg)  SpO2: 96%  BMI (Calculated): 22.45    Physical Exam Vitals and nursing note reviewed.  Constitutional:      Appearance: Normal appearance.  HENT:     Head: Normocephalic.     Left Ear: There is no impacted cerumen.     Nose: Nose normal.     Mouth/Throat:     Mouth: Mucous membranes are moist.     Pharynx: No posterior oropharyngeal erythema.  Eyes:     Extraocular Movements: Extraocular movements intact.     Pupils: Pupils are equal, round, and reactive to light.  Cardiovascular:     Rate and Rhythm: Regular rhythm.     Chest Wall: PMI is not displaced.     Pulses: Normal pulses.     Heart sounds: Normal heart sounds. No murmur heard. Pulmonary:     Effort: Pulmonary effort is normal.     Breath sounds: Normal air entry. No rhonchi or rales.  Abdominal:     General: Abdomen is flat. Bowel sounds are normal. There is no distension.     Palpations: Abdomen is soft. There is no hepatomegaly, splenomegaly or mass.     Tenderness: There is no abdominal tenderness.  Musculoskeletal:        General: Normal range of motion.     Cervical back: Normal range of motion and neck supple.     Right lower leg: No edema.     Left lower leg: No edema.  Skin:    General: Skin is warm and dry.  Neurological:     General: No focal deficit present.     Mental Status: He is alert and oriented to person, place, and time.     Cranial Nerves: No cranial nerve deficit.     Motor: No weakness.  Psychiatric:        Mood and Affect: Mood normal.        Behavior: Behavior normal.      No results found for any visits on 08/18/23.  Recent Results (from the past 2160 hours)  Lipid panel     Status: Abnormal   Collection Time: 08/16/23  8:59 AM  Result  Value Ref Range   Cholesterol, Total 223 (H) 100 - 199 mg/dL   Triglycerides 784 (H) 0 - 149 mg/dL   HDL 43 >69 mg/dL   VLDL Cholesterol Cal 57 (H) 5 -  40 mg/dL   LDL Chol Calc (NIH) 086 (H) 0 - 99 mg/dL   Chol/HDL Ratio 5.2 (H) 0.0 - 5.0 ratio    Comment:                                   T. Chol/HDL Ratio                                             Men  Women                               1/2 Avg.Risk  3.4    3.3                                   Avg.Risk  5.0    4.4                                2X Avg.Risk  9.6    7.1                                3X Avg.Risk 23.4   11.0   Hepatic function panel     Status: None   Collection Time: 08/16/23  8:59 AM  Result Value Ref Range   Total Protein 7.0 6.0 - 8.5 g/dL   Albumin 4.6 3.8 - 4.9 g/dL   Bilirubin Total 0.3 0.0 - 1.2 mg/dL   Bilirubin, Direct 5.78 0.00 - 0.40 mg/dL   Alkaline Phosphatase 70 44 - 121 IU/L   AST 31 0 - 40 IU/L   ALT 15 0 - 44 IU/L  CMP14+EGFR     Status: Abnormal   Collection Time: 08/16/23  8:59 AM  Result Value Ref Range   Glucose 126 (H) 70 - 99 mg/dL   BUN 13 6 - 24 mg/dL   Creatinine, Ser 4.69 0.76 - 1.27 mg/dL   eGFR 89 >62 XB/MWU/1.32   BUN/Creatinine Ratio 13 9 - 20   Sodium 140 134 - 144 mmol/L   Potassium 4.7 3.5 - 5.2 mmol/L   Chloride 101 96 - 106 mmol/L   CO2 22 20 - 29 mmol/L   Calcium  10.0 8.7 - 10.2 mg/dL   Total Protein 6.9 6.0 - 8.5 g/dL   Albumin 4.4 3.8 - 4.9 g/dL   Globulin, Total 2.5 1.5 - 4.5 g/dL   Bilirubin Total 0.3 0.0 - 1.2 mg/dL   Alkaline Phosphatase 69 44 - 121 IU/L   AST 32 0 - 40 IU/L   ALT 13 0 - 44 IU/L  CBC with Diff     Status: None   Collection Time: 08/16/23  8:59 AM  Result Value Ref Range   WBC 9.3 3.4 - 10.8 x10E3/uL   RBC 4.51 4.14 - 5.80 x10E6/uL   Hemoglobin 13.9 13.0 - 17.7 g/dL   Hematocrit 44.0 10.2 - 51.0 %   MCV 95 79 - 97 fL   MCH 30.8 26.6 - 33.0 pg   MCHC 32.6 31.5 - 35.7 g/dL   RDW 72.5 36.6 - 44.0 %  Platelets 286 150 - 450 x10E3/uL    Neutrophils 63 Not Estab. %   Lymphs 27 Not Estab. %   Monocytes 9 Not Estab. %   Eos 1 Not Estab. %   Basos 0 Not Estab. %   Neutrophils Absolute 5.8 1.4 - 7.0 x10E3/uL   Lymphocytes Absolute 2.5 0.7 - 3.1 x10E3/uL   Monocytes Absolute 0.9 0.1 - 0.9 x10E3/uL   EOS (ABSOLUTE) 0.1 0.0 - 0.4 x10E3/uL   Basophils Absolute 0.0 0.0 - 0.2 x10E3/uL   Immature Granulocytes 0 Not Estab. %   Immature Grans (Abs) 0.0 0.0 - 0.1 x10E3/uL  Hemoglobin A1c     Status: Abnormal   Collection Time: 08/16/23  8:59 AM  Result Value Ref Range   Hgb A1c MFr Bld 5.8 (H) 4.8 - 5.6 %    Comment:          Prediabetes: 5.7 - 6.4          Diabetes: >6.4          Glycemic control for adults with diabetes: <7.0    Est. average glucose Bld gHb Est-mCnc 120 mg/dL      Assessment & Plan:  As per problem list. Increase rosuvastatin . Problem List Items Addressed This Visit       Cardiovascular and Mediastinum   Primary hypertension - Primary   Relevant Medications   rosuvastatin  (CRESTOR ) 10 MG tablet     Other   Mixed hyperlipidemia   Relevant Medications   rosuvastatin  (CRESTOR ) 10 MG tablet   Prediabetes    Return in about 3 months (around 11/18/2023) for fu with labs prior.   Total time spent: 20 minutes  Arzella Bitters, MD  08/18/2023   This document may have been prepared by [facility] Voice Recognition software and as such may include unintentional dictation errors.

## 2023-08-20 ENCOUNTER — Ambulatory Visit: Payer: Medicare Other | Admitting: Cardiovascular Disease

## 2023-08-20 ENCOUNTER — Encounter: Payer: Self-pay | Admitting: Cardiovascular Disease

## 2023-08-20 VITALS — BP 116/68 | HR 90 | Ht 69.0 in | Wt 145.0 lb

## 2023-08-20 DIAGNOSIS — I4711 Inappropriate sinus tachycardia, so stated: Secondary | ICD-10-CM | POA: Diagnosis not present

## 2023-08-20 DIAGNOSIS — I340 Nonrheumatic mitral (valve) insufficiency: Secondary | ICD-10-CM | POA: Diagnosis not present

## 2023-08-20 DIAGNOSIS — Z0130 Encounter for examination of blood pressure without abnormal findings: Secondary | ICD-10-CM

## 2023-08-20 DIAGNOSIS — E782 Mixed hyperlipidemia: Secondary | ICD-10-CM

## 2023-08-20 DIAGNOSIS — K219 Gastro-esophageal reflux disease without esophagitis: Secondary | ICD-10-CM | POA: Diagnosis not present

## 2023-08-20 DIAGNOSIS — F17210 Nicotine dependence, cigarettes, uncomplicated: Secondary | ICD-10-CM | POA: Diagnosis not present

## 2023-08-20 DIAGNOSIS — R0602 Shortness of breath: Secondary | ICD-10-CM

## 2023-08-20 DIAGNOSIS — I2510 Atherosclerotic heart disease of native coronary artery without angina pectoris: Secondary | ICD-10-CM

## 2023-08-20 MED ORDER — PANTOPRAZOLE SODIUM 40 MG PO TBEC: 40.0000 mg | DELAYED_RELEASE_TABLET | Freq: Every day | ORAL | 11 refills | Status: DC

## 2023-08-20 NOTE — Progress Notes (Signed)
 Cardiology Office Note   Date:  08/20/2023   ID:  ROMALE SMAW, DOB June 24, 1970, MRN [id number]  PCP:  Shari Daughters, MD  Cardiologist:  Debborah Fairly, MD      History of Present Illness: Jesse Nelson is a 53 y.o. male who presents for  Chief Complaint  Patient presents with   Follow-up    3 Months Follow Up    Has had indigestion.      Past Medical History:  Diagnosis Date   Asthma    GERD (gastroesophageal reflux disease)    Hyperlipidemia    Hypertension      History reviewed. No pertinent surgical history.   Current Outpatient Medications  Medication Sig Dispense Refill   pantoprazole  (PROTONIX ) 40 MG tablet Take 1 tablet (40 mg total) by mouth daily. 30 tablet 11   albuterol  (VENTOLIN  HFA) 108 (90 Base) MCG/ACT inhaler INHALE 1 PUFF EVERY 6 HOURS AS NEEDED 18 g 5   amLODipine  (NORVASC ) 5 MG tablet TAKE 1 TABLET(5 MG) BY MOUTH EVERY MORNING 90 tablet 2   famotidine  (PEPCID ) 20 MG tablet Take 1 tablet (20 mg total) by mouth 2 (two) times daily. 90 tablet 1   fluticasone (FLONASE) 50 MCG/ACT nasal spray Place 2 sprays into both nostrils daily.     fluticasone-salmeterol (ADVAIR HFA) 115-21 MCG/ACT inhaler Inhale 2 puffs into the lungs 2 (two) times daily. (Patient not taking: Reported on 05/22/2022)     lisinopril -hydrochlorothiazide  (ZESTORETIC ) 20-12.5 MG tablet TAKE 2 TABLETS BY MOUTH DAILY 180 tablet 0   metoprolol  (TOPROL  XL) 200 MG 24 hr tablet Take 1 tablet (200 mg total) by mouth daily. 90 tablet 3   montelukast (SINGULAIR) 10 MG tablet TAKE 1 TABLET BY MOUTH EVERY MORNING 90 tablet 1   nitroGLYCERIN  (NITROSTAT ) 0.4 MG SL tablet Place 1 tablet (0.4 mg total) under the tongue every 5 (five) minutes as needed for chest pain. 100 tablet 3   oxyCODONE-acetaminophen (PERCOCET) 10-325 MG tablet Take 1 tablet by mouth every 4 (four) hours as needed for pain.     rosuvastatin  (CRESTOR ) 10 MG tablet Take 1 tablet (10 mg total) by mouth daily. 90 tablet 1    No current facility-administered medications for this visit.    Allergies:   Patient has no known allergies.    Social History:   reports that he has been smoking cigarettes. He has a 26 pack-year smoking history. He has never used smokeless tobacco. No history on file for alcohol use and drug use.   Family History:  family history is not on file.    ROS:     Review of Systems  Constitutional: Negative.   HENT: Negative.    Eyes: Negative.   Respiratory: Negative.    Gastrointestinal: Negative.   Genitourinary: Negative.   Musculoskeletal: Negative.   Skin: Negative.   Neurological: Negative.   Endo/Heme/Allergies: Negative.   Psychiatric/Behavioral: Negative.    All other systems reviewed and are negative.     All other systems are reviewed and negative.    PHYSICAL EXAM: VS:  BP 116/68   Pulse 90   Ht 5\' 9"  (1.753 m)   Wt 145 lb (65.8 kg)   SpO2 98%   BMI 21.41 kg/m  , BMI Body mass index is 21.41 kg/m. Last weight:  Wt Readings from Last 3 Encounters:  08/20/23 145 lb (65.8 kg)  08/18/23 147 lb 9.6 oz (67 kg)  05/17/23 148 lb (67.1 kg)     Physical  Exam Vitals reviewed.  Constitutional:      Appearance: Normal appearance. He is normal weight.  HENT:     Head: Normocephalic.     Nose: Nose normal.     Mouth/Throat:     Mouth: Mucous membranes are moist.  Eyes:     Pupils: Pupils are equal, round, and reactive to light.  Cardiovascular:     Rate and Rhythm: Normal rate and regular rhythm.     Pulses: Normal pulses.     Heart sounds: Normal heart sounds.  Pulmonary:     Effort: Pulmonary effort is normal.  Abdominal:     General: Abdomen is flat. Bowel sounds are normal.  Musculoskeletal:        General: Normal range of motion.     Cervical back: Normal range of motion.  Skin:    General: Skin is warm.  Neurological:     General: No focal deficit present.     Mental Status: He is alert.  Psychiatric:        Mood and Affect: Mood normal.        EKG:   Recent Labs: 08/16/2023: ALT 15; ALT 13; BUN 13; Creatinine, Ser 1.01; Hemoglobin 13.9; Platelets 286; Potassium 4.7; Sodium 140    Lipid Panel    Component Value Date/Time   CHOL 223 (H) 08/16/2023 0859   TRIG 323 (H) 08/16/2023 0859   HDL 43 08/16/2023 0859   CHOLHDL 5.2 (H) 08/16/2023 0859   LDLCALC 123 (H) 08/16/2023 8657      Other studies Reviewed: Additional studies/ records that were reviewed today include:  Review of the above records demonstrates:       No data to display            ASSESSMENT AND PLAN:    ICD-10-CM   1. Inappropriate sinus tachycardia (HCC)  I47.11 pantoprazole  (PROTONIX ) 40 MG tablet    2. GERD without esophagitis  K21.9 pantoprazole  (PROTONIX ) 40 MG tablet   start protonix  as has heart burn    3. Nonrheumatic mitral valve regurgitation  I34.0 pantoprazole  (PROTONIX ) 40 MG tablet    4. SOB (shortness of breath)  R06.02 pantoprazole  (PROTONIX ) 40 MG tablet    5. Coronary artery disease involving native coronary artery of native heart without angina pectoris  I25.10 pantoprazole  (PROTONIX ) 40 MG tablet    6. Mixed hyperlipidemia  E78.2 pantoprazole  (PROTONIX ) 40 MG tablet       Problem List Items Addressed This Visit       Digestive   GERD without esophagitis   Relevant Medications   pantoprazole  (PROTONIX ) 40 MG tablet     Other   Mixed hyperlipidemia   Relevant Medications   pantoprazole  (PROTONIX ) 40 MG tablet   Other Visit Diagnoses       Inappropriate sinus tachycardia (HCC)    -  Primary   Relevant Medications   pantoprazole  (PROTONIX ) 40 MG tablet     Nonrheumatic mitral valve regurgitation       Relevant Medications   pantoprazole  (PROTONIX ) 40 MG tablet     SOB (shortness of breath)       Relevant Medications   pantoprazole  (PROTONIX ) 40 MG tablet     Coronary artery disease involving native coronary artery of native heart without angina pectoris       Relevant Medications   pantoprazole   (PROTONIX ) 40 MG tablet          Disposition:   Return in about 2 months (around 10/20/2023).  Total time spent: 30 minutes  Signed,  Debborah Fairly, MD  08/20/2023 10:47 AM    [facility]

## 2023-09-02 ENCOUNTER — Encounter: Payer: Self-pay | Admitting: Gastroenterology

## 2023-09-09 ENCOUNTER — Ambulatory Visit

## 2023-09-09 ENCOUNTER — Encounter: Payer: Self-pay | Admitting: Gastroenterology

## 2023-09-09 ENCOUNTER — Encounter
Admission: RE | Disposition: A | Discharge status: Home / Self Care | Payer: Self-pay | Source: Home / Self Care | Attending: Gastroenterology

## 2023-09-09 ENCOUNTER — Ambulatory Visit
Admission: RE | Admit: 2023-09-09 | Discharge: 2023-09-09 | Disposition: A | Discharge status: Home / Self Care | Attending: Gastroenterology | Admitting: Gastroenterology

## 2023-09-09 DIAGNOSIS — K635 Polyp of colon: Secondary | ICD-10-CM | POA: Insufficient documentation

## 2023-09-09 DIAGNOSIS — J45909 Unspecified asthma, uncomplicated: Secondary | ICD-10-CM | POA: Insufficient documentation

## 2023-09-09 DIAGNOSIS — I2510 Atherosclerotic heart disease of native coronary artery without angina pectoris: Secondary | ICD-10-CM | POA: Insufficient documentation

## 2023-09-09 DIAGNOSIS — K2950 Unspecified chronic gastritis without bleeding: Secondary | ICD-10-CM | POA: Diagnosis not present

## 2023-09-09 DIAGNOSIS — R011 Cardiac murmur, unspecified: Secondary | ICD-10-CM | POA: Diagnosis not present

## 2023-09-09 DIAGNOSIS — Z83719 Family history of colon polyps, unspecified: Secondary | ICD-10-CM | POA: Insufficient documentation

## 2023-09-09 DIAGNOSIS — I10 Essential (primary) hypertension: Secondary | ICD-10-CM | POA: Diagnosis not present

## 2023-09-09 DIAGNOSIS — D122 Benign neoplasm of ascending colon: Secondary | ICD-10-CM | POA: Insufficient documentation

## 2023-09-09 DIAGNOSIS — I341 Nonrheumatic mitral (valve) prolapse: Secondary | ICD-10-CM | POA: Insufficient documentation

## 2023-09-09 DIAGNOSIS — Z7902 Long term (current) use of antithrombotics/antiplatelets: Secondary | ICD-10-CM | POA: Insufficient documentation

## 2023-09-09 DIAGNOSIS — R1310 Dysphagia, unspecified: Secondary | ICD-10-CM | POA: Diagnosis present

## 2023-09-09 DIAGNOSIS — K224 Dyskinesia of esophagus: Secondary | ICD-10-CM | POA: Diagnosis not present

## 2023-09-09 DIAGNOSIS — Z1211 Encounter for screening for malignant neoplasm of colon: Secondary | ICD-10-CM | POA: Insufficient documentation

## 2023-09-09 DIAGNOSIS — K219 Gastro-esophageal reflux disease without esophagitis: Secondary | ICD-10-CM | POA: Diagnosis not present

## 2023-09-09 DIAGNOSIS — K2289 Other specified disease of esophagus: Secondary | ICD-10-CM | POA: Insufficient documentation

## 2023-09-09 DIAGNOSIS — K625 Hemorrhage of anus and rectum: Secondary | ICD-10-CM | POA: Diagnosis not present

## 2023-09-09 DIAGNOSIS — D128 Benign neoplasm of rectum: Secondary | ICD-10-CM | POA: Insufficient documentation

## 2023-09-09 DIAGNOSIS — Z79899 Other long term (current) drug therapy: Secondary | ICD-10-CM | POA: Diagnosis not present

## 2023-09-09 DIAGNOSIS — I4711 Inappropriate sinus tachycardia, so stated: Secondary | ICD-10-CM | POA: Insufficient documentation

## 2023-09-09 DIAGNOSIS — K317 Polyp of stomach and duodenum: Secondary | ICD-10-CM | POA: Diagnosis not present

## 2023-09-09 DIAGNOSIS — K5730 Diverticulosis of large intestine without perforation or abscess without bleeding: Secondary | ICD-10-CM | POA: Insufficient documentation

## 2023-09-09 DIAGNOSIS — D124 Benign neoplasm of descending colon: Secondary | ICD-10-CM | POA: Insufficient documentation

## 2023-09-09 HISTORY — DX: Atherosclerotic heart disease of native coronary artery without angina pectoris: I25.10

## 2023-09-09 HISTORY — DX: Cardiac arrhythmia, unspecified: I49.9

## 2023-09-09 HISTORY — PX: ESOPHAGOGASTRODUODENOSCOPY: SHX5428

## 2023-09-09 HISTORY — PX: COLONOSCOPY: SHX5424

## 2023-09-09 SURGERY — COLONOSCOPY
Anesthesia: General

## 2023-09-09 MED ORDER — PHENYLEPHRINE 80 MCG/ML (10ML) SYRINGE FOR IV PUSH (FOR BLOOD PRESSURE SUPPORT)
PREFILLED_SYRINGE | INTRAVENOUS | Status: AC
  Filled 2023-09-09: qty 10

## 2023-09-09 MED ORDER — PHENYLEPHRINE 80 MCG/ML (10ML) SYRINGE FOR IV PUSH (FOR BLOOD PRESSURE SUPPORT)
PREFILLED_SYRINGE | INTRAVENOUS | Status: DC | PRN
  Administered 2023-09-09 (×3): 80 ug via INTRAVENOUS

## 2023-09-09 MED ORDER — GLYCOPYRROLATE 0.2 MG/ML IJ SOLN
INTRAMUSCULAR | Status: AC
  Filled 2023-09-09: qty 2

## 2023-09-09 MED ORDER — PROPOFOL 1000 MG/100ML IV EMUL
INTRAVENOUS | Status: AC
  Filled 2023-09-09: qty 100

## 2023-09-09 MED ORDER — LIDOCAINE HCL (CARDIAC) PF 100 MG/5ML IV SOSY
PREFILLED_SYRINGE | INTRAVENOUS | Status: DC | PRN
  Administered 2023-09-09: 100 mg via INTRAVENOUS

## 2023-09-09 MED ORDER — SODIUM CHLORIDE 0.9 % IV SOLN: INTRAVENOUS | Status: DC

## 2023-09-09 MED ORDER — PROPOFOL 500 MG/50ML IV EMUL
INTRAVENOUS | Status: DC | PRN
  Administered 2023-09-09: 150 ug/kg/min via INTRAVENOUS

## 2023-09-09 MED ORDER — PROPOFOL 1000 MG/100ML IV EMUL
INTRAVENOUS | Status: AC
  Filled 2023-09-09: qty 200

## 2023-09-09 MED ORDER — GLYCOPYRROLATE 0.2 MG/ML IJ SOLN
INTRAMUSCULAR | Status: DC | PRN
  Administered 2023-09-09: .2 mg via INTRAVENOUS

## 2023-09-09 MED ORDER — PROPOFOL 10 MG/ML IV BOLUS
INTRAVENOUS | Status: DC | PRN
  Administered 2023-09-09 (×2): 100 mg via INTRAVENOUS

## 2023-09-09 MED ORDER — LIDOCAINE HCL (PF) 2 % IJ SOLN
INTRAMUSCULAR | Status: AC
  Filled 2023-09-09: qty 10

## 2023-09-09 NOTE — Transfer of Care (Signed)
 Immediate Anesthesia Transfer of Care Note  Patient: Jesse Nelson  Procedure(s) Performed: COLONOSCOPY EGD (ESOPHAGOGASTRODUODENOSCOPY)  Patient Location: PACU  Anesthesia Type:General  Level of Consciousness: awake and drowsy  Airway & Oxygen Therapy: Patient Spontanous Breathing  Post-op Assessment: Report given to RN and Post -op Vital signs reviewed and stable  Post vital signs: Reviewed and stable  Last Vitals:  Vitals Value Taken Time  BP    Temp    Pulse 79 09/09/23 0823  Resp 23 09/09/23 0823  SpO2 96 % 09/09/23 0823  Vitals shown include unfiled device data.  Last Pain:  Vitals:   09/09/23 0708  TempSrc: Temporal  PainSc: 0-No pain         Complications: No notable events documented.

## 2023-09-09 NOTE — H&P (Signed)
 Pre-Procedure H&P   Patient ID: Jesse Nelson is a 53 y.o. male.  Gastroenterology Provider: [name], DO  Referring Provider: Jamee Mazzoni, PA PCP: Shari Daughters, MD  Date: 09/09/2023  HPI Jesse Nelson is a 53 y.o. male who presents today for Esophagogastroduodenoscopy and Colonoscopy for dysphagia, gerd, colorectal cancer screening .  No previous EGD or colonoscopy.  Dysphagia to solids for approximately 2 years.  No issues with liquids or pills.  No odynophagia weight changes or appetite changes.  Acid reflux is well-controlled.  Active tobacco use.  Intermittent abdominal pain lasting 20 to 30 minutes at a time consisting of bloating over the last 4 to 5 years.  He notes some bright red blood per rectum with wiping.  No other hematochezia or melena.  2-4 bowel movements per day  Patient's Plavix  has been held for the last 4 days  Father with a history of colon polyps  Hemoglobin 15 MCV 94 platelets 203,000 creatinine 1.1    Past Medical History:  Diagnosis Date   Asthma    Coronary artery disease    Dysrhythmia    GERD (gastroesophageal reflux disease)    Hyperlipidemia    Hypertension     Past Surgical History:  Procedure Laterality Date   shoulder spur removed Left     Family History Father- colon polyps No h/o GI disease or malignancy  Review of Systems  Constitutional:  Negative for activity change, appetite change, chills, diaphoresis, fatigue, fever and unexpected weight change.  HENT:  Positive for trouble swallowing. Negative for voice change.   Respiratory:  Negative for shortness of breath and wheezing.   Cardiovascular:  Negative for chest pain, palpitations and leg swelling.  Gastrointestinal:  Negative for abdominal distention, abdominal pain, anal bleeding, blood in stool, constipation, diarrhea, nausea and vomiting.  Musculoskeletal:  Negative for arthralgias and myalgias.  Skin:  Negative for color change and  pallor.  Neurological:  Negative for dizziness, syncope and weakness.  Psychiatric/Behavioral:  Negative for confusion. The patient is not nervous/anxious.   All other systems reviewed and are negative.    Medications No current facility-administered medications on file prior to encounter.   Current Outpatient Medications on File Prior to Encounter  Medication Sig Dispense Refill   albuterol  (VENTOLIN  HFA) 108 (90 Base) MCG/ACT inhaler INHALE 1 PUFF EVERY 6 HOURS AS NEEDED 18 g 5   amLODipine  (NORVASC ) 5 MG tablet TAKE 1 TABLET(5 MG) BY MOUTH EVERY MORNING 90 tablet 2   clopidogrel  (PLAVIX ) 75 MG tablet Take 75 mg by mouth daily.     famotidine  (PEPCID ) 20 MG tablet Take 1 tablet (20 mg total) by mouth 2 (two) times daily. 90 tablet 1   lisinopril -hydrochlorothiazide  (ZESTORETIC ) 20-12.5 MG tablet TAKE 2 TABLETS BY MOUTH DAILY 180 tablet 0   metoprolol  (TOPROL  XL) 200 MG 24 hr tablet Take 1 tablet (200 mg total) by mouth daily. 90 tablet 3   montelukast (SINGULAIR) 10 MG tablet TAKE 1 TABLET BY MOUTH EVERY MORNING 90 tablet 1   oxyCODONE-acetaminophen (PERCOCET) 10-325 MG tablet Take 1 tablet by mouth every 4 (four) hours as needed for pain.     fluticasone (FLONASE) 50 MCG/ACT nasal spray Place 2 sprays into both nostrils daily.     fluticasone-salmeterol (ADVAIR HFA) 115-21 MCG/ACT inhaler Inhale 2 puffs into the lungs 2 (two) times daily. (Patient not taking: Reported on 05/22/2022)     nitroGLYCERIN  (NITROSTAT ) 0.4 MG SL tablet Place 1 tablet (0.4 mg total)  under the tongue every 5 (five) minutes as needed for chest pain. 100 tablet 3    Pertinent medications related to GI and procedure were reviewed by me with the patient prior to the procedure   Current Facility-Administered Medications:    0.9 %  sodium chloride infusion, , Intravenous, Continuous, [name], DO, Last Rate: 20 mL/hr at 09/09/23 0718, New Bag at 09/09/23 0718  sodium chloride 20 mL/hr at 09/09/23 6295        No Known Allergies Allergies were reviewed by me prior to the procedure  Objective   Body mass index is 21.56 kg/m. Vitals:   09/09/23 0708 09/09/23 0726  BP: (!) 127/112   Pulse: 92   Resp: 16   Temp: (!) 96.9 F (36.1 C)   TempSrc: Temporal   SpO2: 98%   Weight:  66.2 kg     Physical Exam Vitals and nursing note reviewed.  Constitutional:      General: He is not in acute distress.    Appearance: Normal appearance. He is not ill-appearing, toxic-appearing or diaphoretic.  HENT:     Head: Normocephalic and atraumatic.     Nose: Nose normal.     Mouth/Throat:     Mouth: Mucous membranes are moist.     Pharynx: Oropharynx is clear.  Eyes:     General: No scleral icterus.    Extraocular Movements: Extraocular movements intact.  Cardiovascular:     Rate and Rhythm: Normal rate and regular rhythm.     Heart sounds: Normal heart sounds. No murmur heard.    No friction rub. No gallop.  Pulmonary:     Effort: Pulmonary effort is normal. No respiratory distress.     Breath sounds: Normal breath sounds. No wheezing, rhonchi or rales.  Abdominal:     General: Bowel sounds are normal. There is no distension.     Palpations: Abdomen is soft.     Tenderness: There is no abdominal tenderness. There is no guarding or rebound.  Musculoskeletal:     Cervical back: Neck supple.     Right lower leg: No edema.     Left lower leg: No edema.  Skin:    General: Skin is warm and dry.     Coloration: Skin is not jaundiced or pale.     Comments: Facial telangiectasias  Neurological:     General: No focal deficit present.     Mental Status: He is alert and oriented to person, place, and time. Mental status is at baseline.  Psychiatric:        Mood and Affect: Mood normal.        Behavior: Behavior normal.        Thought Content: Thought content normal.        Judgment: Judgment normal.      Assessment:  Jesse Nelson is a 53 y.o. male  who presents today for  Esophagogastroduodenoscopy and Colonoscopy for dysphagia, gerd, colorectal cancer screening .  Plan:  Esophagogastroduodenoscopy and Colonoscopy with possible intervention today  Esophagogastroduodenoscopy and Colonoscopy with possible biopsy, control of bleeding, polypectomy, and interventions as necessary has been discussed with the patient/patient representative. Informed consent was obtained from the patient/patient representative after explaining the indication, nature, and risks of the procedure including but not limited to death, bleeding, perforation, missed neoplasm/lesions, cardiorespiratory compromise, and reaction to medications. Opportunity for questions was given and appropriate answers were provided. Patient/patient representative has verbalized understanding is amenable to undergoing the procedure.   Landon Pinion  Michael Adriene Knipfer, DO  [facility]  Portions of the record may have been created with voice recognition software. Occasional wrong-word or 'sound-a-like' substitutions may have occurred due to the inherent limitations of voice recognition software.  Read the chart carefully and recognize, using context, where substitutions may have occurred.

## 2023-09-09 NOTE — Op Note (Signed)
 [facility] Gastroenterology Patient Name: Jesse Nelson Procedure Date: 09/09/2023 7:28 AM MRN: [id number] Account #: [id number] Date of Birth: Jan 30, 1971 Admit Type: Outpatient Age: 53 Room: [facility] ENDO ROOM 1 Gender: Male Note Status: Finalized Instrument Name: Peds Colonoscope [id number] Procedure:             Colonoscopy Indications:           Screening for colorectal malignant neoplasm Providers:             [name] DO, DO Medicines:             Monitored Anesthesia Care Complications:         No immediate complications. Estimated blood loss:                         Minimal. Procedure:             Pre-Anesthesia Assessment:                        - Prior to the procedure, a History and Physical was                         performed, and patient medications and allergies were                         reviewed. The patient is competent. The risks and                         benefits of the procedure and the sedation options and                         risks were discussed with the patient. All questions                         were answered and informed consent was obtained.                         Patient identification and proposed procedure were                         verified by the physician, the nurse, the anesthetist                         and the technician in the endoscopy suite. Mental                         Status Examination: alert and oriented. Airway                         Examination: normal oropharyngeal airway and neck                         mobility. Respiratory Examination: clear to                         auscultation. CV Examination: RRR, no murmurs, no S3                         or S4. Prophylactic Antibiotics: The patient does  not                         require prophylactic antibiotics. Prior                         Anticoagulants: The patient has taken Plavix                          (clopidogrel ), last dose was 4 days prior  to                         procedure. ASA Grade Assessment: III - A patient with                         severe systemic disease. After reviewing the risks and                         benefits, the patient was deemed in satisfactory                         condition to undergo the procedure. The anesthesia                         plan was to use monitored anesthesia care (MAC).                         Immediately prior to administration of medications,                         the patient was re-assessed for adequacy to receive                         sedatives. The heart rate, respiratory rate, oxygen                         saturations, blood pressure, adequacy of pulmonary                         ventilation, and response to care were monitored                         throughout the procedure. The physical status of the                         patient was re-assessed after the procedure.                        After obtaining informed consent, the colonoscope was                         passed under direct vision. Throughout the procedure,                         the patient's blood pressure, pulse, and oxygen                         saturations were monitored continuously. The  Colonoscope was introduced through the anus and                         advanced to the the cecum, identified by appendiceal                         orifice and ileocecal valve. The colonoscopy was                         performed without difficulty. The patient tolerated                         the procedure well. The quality of the bowel                         preparation was evaluated using the BBPS [facility] Bowel                         Preparation Scale) with scores of: Right Colon = 3                         (entire mucosa seen well with no residual staining,                         small fragments of stool or opaque liquid), Transverse                         Colon = 3 (entire mucosa  seen well with no residual                         staining, small fragments of stool or opaque liquid)                         and Left Colon = 2 (minor amount of residual staining,                         small fragments of stool and/or opaque liquid, but                         mucosa seen well). The total BBPS score equals 8. The                         quality of the bowel preparation was excellent. The                         terminal ileum, ileocecal valve, appendiceal orifice,                         and rectum were photographed. Findings:      The perianal and digital rectal examinations were normal. Pertinent       negatives include normal sphincter tone.      The terminal ileum appeared normal. Estimated blood loss: none.      Scattered small-mouthed diverticula were found in the entire colon.       Estimated blood loss: none.      Five sessile polyps were found in the rectum (2), sigmoid colon (1),  descending colon (1) and ascending colon (1). The polyps were 1 to 2 mm       in size. These polyps were removed with a jumbo cold forceps. Resection       and retrieval were complete. Estimated blood loss was minimal.      Four sessile polyps were found in the rectum (1), descending colon (1)       and ascending colon (2). The polyps were 3 to 6 mm in size. These polyps       were removed with a cold snare. Resection and retrieval were complete.       Estimated blood loss was minimal.      The exam was otherwise without abnormality on direct and retroflexion       views. Impression:            - The examined portion of the ileum was normal.                        - Diverticulosis in the entire examined colon.                        - Five 1 to 2 mm polyps in the rectum, in the sigmoid                         colon, in the descending colon and in the ascending                         colon, removed with a jumbo cold forceps. Resected and                         retrieved.                         - Four 3 to 6 mm polyps in the rectum, in the                         descending colon and in the ascending colon, removed                         with a cold snare. Resected and retrieved.                        - The examination was otherwise normal on direct and                         retroflexion views. Recommendation:        - Patient has a contact number available for                         emergencies. The signs and symptoms of potential                         delayed complications were discussed with the patient.                         Return to normal activities tomorrow. Written  discharge instructions were provided to the patient.                        - Discharge patient to home.                        - Resume previous diet.                        - Continue present medications.                        - No ibuprofen, naproxen, or other non-steroidal                         anti-inflammatory drugs for 5 days after polyp removal.                        - Resume Plavix  (clopidogrel ) at prior dose in 2 days.                         Refer to managing physician for further adjustment of                         therapy.                        - Await pathology results.                        - Repeat colonoscopy for surveillance based on                         pathology results.                        - Return to referring physician as previously                         scheduled.                        - The findings and recommendations were discussed with                         the patient. Procedure Code(s):     --- Professional ---                        [phone], Colonoscopy, flexible; with removal of                         tumor(s), polyp(s), or other lesion(s) by snare                         technique                        [id number], 59, Colonoscopy, flexible; with biopsy, single                         or multiple Diagnosis  Code(s):     --- Professional ---  Z12.11, Encounter for screening for malignant neoplasm                         of colon                        D12.5, Benign neoplasm of sigmoid colon                        D12.8, Benign neoplasm of rectum                        D12.4, Benign neoplasm of descending colon                        D12.2, Benign neoplasm of ascending colon                        K57.30, Diverticulosis of large intestine without                         perforation or abscess without bleeding CPT copyright 2022 American Medical Association. All rights reserved. The codes documented in this report are preliminary and upon coder review may  be revised to meet current compliance requirements. Attending Participation:      I personally performed the entire procedure. Polo Brisk, DO [name] DO, DO 09/09/2023 8:29:29 AM This report has been signed electronically. Number of Addenda: 0 Note Initiated On: 09/09/2023 7:28 AM Total Procedure Duration: 0 hours 19 minutes 22 seconds  Estimated Blood Loss:  Estimated blood loss was minimal.      [facility]

## 2023-09-09 NOTE — Anesthesia Preprocedure Evaluation (Addendum)
 Anesthesia Evaluation  Patient identified by MRN, date of birth, ID band Patient awake    Reviewed: Allergy & Precautions, H&P , NPO status , Patient's Chart, lab work & pertinent test results  Airway Mallampati: III  TM Distance: >3 FB Neck ROM: full    Dental no notable dental hx.    Pulmonary shortness of breath and with exertion, asthma , Current SmokerPatient did not abstain from smoking.   Pulmonary exam normal        Cardiovascular hypertension, Pt. on home beta blockers and Pt. on medications + CAD  Normal cardiovascular exam+ dysrhythmias (Inappropriate sinus tachycardia) + Valvular Problems/Murmurs MVP      Neuro/Psych negative neurological ROS  negative psych ROS   GI/Hepatic Neg liver ROS,GERD  Controlled and Medicated,,  Endo/Other  negative endocrine ROS    Renal/GU negative Renal ROS  negative genitourinary   Musculoskeletal   Abdominal   Peds  Hematology negative hematology ROS (+)   Anesthesia Other Findings Past Medical History: No date: Asthma No date: GERD (gastroesophageal reflux disease) No date: Hyperlipidemia No date: Hypertension      Reproductive/Obstetrics negative OB ROS                             Anesthesia Physical Anesthesia Plan  ASA: 3  Anesthesia Plan: General   Post-op Pain Management: Minimal or no pain anticipated   Induction: Intravenous  PONV Risk Score and Plan: Propofol infusion and TIVA  Airway Management Planned: Natural Airway  Additional Equipment:   Intra-op Plan:   Post-operative Plan:   Informed Consent: I have reviewed the patients History and Physical, chart, labs and discussed the procedure including the risks, benefits and alternatives for the proposed anesthesia with the patient or authorized representative who has indicated his/her understanding and acceptance.     Dental Advisory Given  Plan Discussed with:  CRNA and Surgeon  Anesthesia Plan Comments:         Anesthesia Quick Evaluation

## 2023-09-09 NOTE — Op Note (Addendum)
 [facility] Gastroenterology Patient Name: Jesse Nelson Procedure Date: 09/09/2023 7:28 AM MRN: [id number] Account #: [id number] Date of Birth: [birth year] Admit Type: Outpatient Age: 53 Room: [facility] ENDO ROOM 1 Gender: Male Note Status: Supervisor Override Instrument Name: Upper Endoscope [id number] Procedure:             Upper GI endoscopy Indications:           Generalized abdominal pain, Dysphagia,                         Gastro-esophageal reflux disease Providers:             [name] DO, DO Medicines:             Monitored Anesthesia Care Complications:         No immediate complications. Estimated blood loss:                         Minimal. Procedure:             Pre-Anesthesia Assessment:                        - Prior to the procedure, a History and Physical was                         performed, and patient medications and allergies were                         reviewed. The patient is competent. The risks and                         benefits of the procedure and the sedation options and                         risks were discussed with the patient. All questions                         were answered and informed consent was obtained.                         Patient identification and proposed procedure were                         verified by the physician, the nurse, the anesthetist                         and the technician in the endoscopy suite. Mental                         Status Examination: alert and oriented. Airway                         Examination: normal oropharyngeal airway and neck                         mobility. Respiratory Examination: clear to                         auscultation. CV Examination: RRR, no murmurs, no S3  or S4. Prophylactic Antibiotics: The patient does not                         require prophylactic antibiotics. Prior                         Anticoagulants: The patient has taken  Plavix                          (clopidogrel ), last dose was 4 days prior to                         procedure. ASA Grade Assessment: III - A patient with                         severe systemic disease. After reviewing the risks and                         benefits, the patient was deemed in satisfactory                         condition to undergo the procedure. The anesthesia                         plan was to use monitored anesthesia care (MAC).                         Immediately prior to administration of medications,                         the patient was re-assessed for adequacy to receive                         sedatives. The heart rate, respiratory rate, oxygen                         saturations, blood pressure, adequacy of pulmonary                         ventilation, and response to care were monitored                         throughout the procedure. The physical status of the                         patient was re-assessed after the procedure.                        After obtaining informed consent, the endoscope was                         passed under direct vision. Throughout the procedure,                         the patient's blood pressure, pulse, and oxygen                         saturations were monitored continuously. The Endoscope  was introduced through the mouth, and advanced to the                         second part of duodenum. The upper GI endoscopy was                         accomplished without difficulty. The patient tolerated                         the procedure well. Findings:      The duodenal bulb, first portion of the duodenum and second portion of       the duodenum were normal. Biopsies for histology were taken with a cold       forceps for evaluation of celiac disease. Estimated blood loss was       minimal.      Localized moderate inflammation characterized by erythema and       granularity was found in the gastric  antrum. Biopsies were taken with a       cold forceps for Helicobacter pylori testing. Estimated blood loss was       minimal.      Two 2 to 3 mm sessile polyps with no bleeding and no stigmata of recent       bleeding were found in the cardia. Biopsies were taken with a cold       forceps for histology. Estimated blood loss was minimal.      The exam of the stomach was otherwise normal.      The Z-line was variable. <1cm changes      Abnormal motility was noted in the esophagus. The cricopharyngeus was       normal. There is spasticity of the esophageal body. The distal       esophagus/lower esophageal sphincter is spastic, but gives up passage to       the endoscope. The scope was withdrawn. Dilation was performed with a       Maloney dilator with no resistance at 52 Fr. The dilation site was       examined following endoscope reinsertion and showed no change. Estimated       blood loss: none.      Normal mucosa was found in the entire esophagus. Biopsies were obtained       from the proximal and distal esophagus with cold forceps for histology       of suspected eosinophilic esophagitis. Estimated blood loss was minimal.      The exam of the esophagus was otherwise normal. Impression:            - Normal duodenal bulb, first portion of the duodenum                         and second portion of the duodenum. Biopsied.                        - Gastritis. Biopsied.                        - Two gastric polyps. Biopsied.                        - Z-line variable.                        -  Abnormal esophageal motility, suspicious for                         esophageal spasm. Dilated.                        - Normal mucosa was found in the entire esophagus.                        - Biopsies were taken with a cold forceps for                         evaluation of eosinophilic esophagitis. Recommendation:        - Patient has a contact number available for                         emergencies.  The signs and symptoms of potential                         delayed complications were discussed with the patient.                         Return to normal activities tomorrow. Written                         discharge instructions were provided to the patient.                        - Discharge patient to home.                        - Resume previous diet.                        - Continue present medications.                        - Await pathology results.                        - Repeat upper endoscopy for surveillance based on                         pathology results.                        - Return to referring physician as previously                         scheduled.                        - proceed with colonoscopy. see report for further                         recommendations and plavix  recommendations.                        - The findings and recommendations were discussed with                         the  patient. Procedure Code(s):     --- Professional ---                        [phone], Esophagogastroduodenoscopy, flexible,                         transoral; with biopsy, single or multiple                        43450, Dilation of esophagus, by unguided sound or                         bougie, single or multiple passes Diagnosis Code(s):     --- Professional ---                        K29.70, Gastritis, unspecified, without bleeding                        K31.7, Polyp of stomach and duodenum                        K22.89, Other specified disease of esophagus                        K22.4, Dyskinesia of esophagus                        R10.84, Generalized abdominal pain                        R13.10, Dysphagia, unspecified CPT copyright 2022 American Medical Association. All rights reserved. The codes documented in this report are preliminary and upon coder review may  be revised to meet current compliance requirements. Attending Participation:      I personally  performed the entire procedure. Polo Brisk, DO [name] DO, DO 09/09/2023 7:57:28 AM This report has been signed electronically. Number of Addenda: 0 Note Initiated On: 09/09/2023 7:28 AM Estimated Blood Loss:  Estimated blood loss was minimal.      [facility]

## 2023-09-09 NOTE — Interval H&P Note (Signed)
 History and Physical Interval Note: Preprocedure H&P from 09/09/23  was reviewed and there was no interval change after seeing and examining the patient.  Written consent was obtained from the patient after discussion of risks, benefits, and alternatives. Patient has consented to proceed with Esophagogastroduodenoscopy and Colonoscopy with possible intervention   09/09/2023 7:38 AM  Jesse Nelson  has presented today for surgery, with the diagnosis of Z12.11 (ICD-10-CM) - Colon cancer screening R13.19 (ICD-10-CM) - Esophageal dysphagia K21.9 (ICD-10-CM) - Gastroesophageal reflux disease, unspecified whether esophagitis present R10.84 (ICD-10-CM) - Abdominal pain, generalized.  The various methods of treatment have been discussed with the patient and family. After consideration of risks, benefits and other options for treatment, the patient has consented to  Procedure(s) with comments: COLONOSCOPY (N/A) - Patient is on Plavix  EGD (ESOPHAGOGASTRODUODENOSCOPY) (N/A) as a surgical intervention.  The patient's history has been reviewed, patient examined, no change in status, stable for surgery.  I have reviewed the patient's chart and labs.  Questions were answered to the patient's satisfaction.     [name]

## 2023-09-09 NOTE — Anesthesia Postprocedure Evaluation (Signed)
 Anesthesia Post Note  Patient: Jesse Nelson  Procedure(s) Performed: COLONOSCOPY EGD (ESOPHAGOGASTRODUODENOSCOPY)  Patient location during evaluation: Endoscopy Anesthesia Type: General Level of consciousness: awake and alert Pain management: pain level controlled Vital Signs Assessment: post-procedure vital signs reviewed and stable Respiratory status: spontaneous breathing, nonlabored ventilation and respiratory function stable Cardiovascular status: blood pressure returned to baseline and stable Postop Assessment: no apparent nausea or vomiting Anesthetic complications: no   No notable events documented.   Last Vitals:  Vitals:   09/09/23 0833 09/09/23 0843  BP: (!) 96/53 99/71  Pulse:  85  Resp: 15 14  Temp:    SpO2: 98% 97%    Last Pain:  Vitals:   09/09/23 0843  TempSrc:   PainSc: 0-No pain                 Baltazar Bonier

## 2023-09-10 LAB — SURGICAL PATHOLOGY

## 2023-10-01 ENCOUNTER — Other Ambulatory Visit: Payer: Self-pay | Admitting: Internal Medicine

## 2023-10-01 ENCOUNTER — Other Ambulatory Visit: Payer: Self-pay | Admitting: Cardiovascular Disease

## 2023-10-14 ENCOUNTER — Other Ambulatory Visit: Payer: Self-pay | Admitting: Cardiovascular Disease

## 2023-11-10 ENCOUNTER — Other Ambulatory Visit: Payer: Self-pay | Admitting: Cardiology

## 2023-11-19 ENCOUNTER — Other Ambulatory Visit

## 2023-11-20 LAB — HEPATIC FUNCTION PANEL
ALT: 15 IU/L (ref 0–44)
AST: 43 IU/L — ABNORMAL HIGH (ref 0–40)
Albumin: 4.3 g/dL (ref 3.8–4.9)
Alkaline Phosphatase: 66 IU/L (ref 44–121)
Bilirubin Total: 0.3 mg/dL (ref 0.0–1.2)
Bilirubin, Direct: 0.13 mg/dL (ref 0.00–0.40)
Total Protein: 6.9 g/dL (ref 6.0–8.5)

## 2023-11-20 LAB — LIPID PANEL
Chol/HDL Ratio: 3.2 ratio (ref 0.0–5.0)
Cholesterol, Total: 158 mg/dL (ref 100–199)
HDL: 50 mg/dL (ref >39)
LDL Chol Calc (NIH): 66 mg/dL (ref 0–99)
Triglycerides: 263 mg/dL — ABNORMAL HIGH (ref 0–149)
VLDL Cholesterol Cal: 42 mg/dL — ABNORMAL HIGH (ref 5–40)

## 2023-11-20 LAB — CK: Total CK: 91 U/L (ref 41–331)

## 2023-11-22 ENCOUNTER — Encounter: Payer: Self-pay | Admitting: Internal Medicine

## 2023-11-22 ENCOUNTER — Ambulatory Visit: Payer: Self-pay | Admitting: Internal Medicine

## 2023-11-22 ENCOUNTER — Ambulatory Visit (INDEPENDENT_AMBULATORY_CARE_PROVIDER_SITE_OTHER): Admitting: Internal Medicine

## 2023-11-22 VITALS — BP 120/86 | HR 93 | Temp 98.6°F | Ht 68.0 in | Wt 148.4 lb

## 2023-11-22 DIAGNOSIS — J4540 Moderate persistent asthma, uncomplicated: Secondary | ICD-10-CM

## 2023-11-22 DIAGNOSIS — E782 Mixed hyperlipidemia: Secondary | ICD-10-CM | POA: Diagnosis not present

## 2023-11-22 DIAGNOSIS — Z0130 Encounter for examination of blood pressure without abnormal findings: Secondary | ICD-10-CM

## 2023-11-22 DIAGNOSIS — E538 Deficiency of other specified B group vitamins: Secondary | ICD-10-CM | POA: Diagnosis not present

## 2023-11-22 MED ORDER — VITAMIN B 12 500 MCG PO TABS
1000.0000 ug | ORAL_TABLET | Freq: Every day | ORAL | 0 refills | Status: AC
Start: 2023-11-22 — End: 2024-02-20

## 2023-11-22 MED ORDER — ALBUTEROL SULFATE HFA 108 (90 BASE) MCG/ACT IN AERS
INHALATION_SPRAY | RESPIRATORY_TRACT | 5 refills | Status: AC
Start: 2023-11-22 — End: ?

## 2023-11-22 NOTE — Progress Notes (Signed)
 Established Patient Office Visit  Subjective:  Patient ID: Jesse Nelson, male    DOB: 25-May-1970  Age: 53 y.o. MRN: [id number]  Chief Complaint  Patient presents with   Follow-up    3 month follow up    No new complaints, here for lab review and medication refills. Labs reviewed and notable for improvement in lipids, both TC and LDL now at target but trigs still elevated. AST elevated to which he admits heavy etoh consumption.    No other concerns at this time.   Past Medical History:  Diagnosis Date   Asthma    Coronary artery disease    Dysrhythmia    GERD (gastroesophageal reflux disease)    Hyperlipidemia    Hypertension     Past Surgical History:  Procedure Laterality Date   COLONOSCOPY N/A 09/09/2023   Procedure: COLONOSCOPY;  Surgeon: Onita Elspeth Sharper, DO;  Location: [facility] ENDOSCOPY;  Service: Gastroenterology;  Laterality: N/A;  Patient is on Plavix    ESOPHAGOGASTRODUODENOSCOPY N/A 09/09/2023   Procedure: EGD (ESOPHAGOGASTRODUODENOSCOPY);  Surgeon: Onita Elspeth Sharper, DO;  Location: [facility] ENDOSCOPY;  Service: Gastroenterology;  Laterality: N/A;   shoulder spur removed Left     Social History   Socioeconomic History   Marital status: Married    Spouse name: Not on file   Number of children: Not on file   Years of education: Not on file   Highest education level: Not on file  Occupational History   Not on file  Tobacco Use   Smoking status: Every Day    Current packs/day: 1.00    Average packs/day: 1 pack/day for 26.0 years (26.0 ttl pk-yrs)    Types: Cigarettes   Smokeless tobacco: Never  Vaping Use   Vaping status: Never Used  Substance and Sexual Activity   Alcohol use: Not on file    Comment: 3 in the afternoons   Drug use: Not Currently   Sexual activity: Not on file  Other Topics Concern   Not on file  Social History Narrative   Not on file   Social Drivers of Health   Financial Resource Strain: Medium Risk (08/11/2023)   Received  from [facility]   Overall Financial Resource Strain (CARDIA)    Difficulty of Paying Living Expenses: Somewhat hard  Food Insecurity: No Food Insecurity (08/11/2023)   Received from [facility]   Hunger Vital Sign    Within the past 12 months, you worried that your food would run out before you got the money to buy more.: Never true    Within the past 12 months, the food you bought just didn't last and you didn't have money to get more.: Never true  Transportation Needs: No Transportation Needs (08/11/2023)   Received from [facility] - Transportation    In the past 12 months, has lack of transportation kept you from medical appointments or from getting medications?: No    Lack of Transportation (Non-Medical): No  Physical Activity: Not on file  Stress: Not on file  Social Connections: Not on file  Intimate Partner Violence: Not on file    No family history on file.  No Known Allergies  Outpatient Medications Prior to Visit  Medication Sig   amLODipine  (NORVASC ) 5 MG tablet TAKE 1 TABLET(5 MG) BY MOUTH EVERY MORNING   clopidogrel  (PLAVIX ) 75 MG tablet TAKE 1 TABLET(75 MG) BY MOUTH 1 TIME FOR 1 DOSE   famotidine  (PEPCID ) 20 MG tablet  Take 1 tablet (20 mg total) by mouth 2 (two) times daily.   fluticasone (FLONASE) 50 MCG/ACT nasal spray Place 2 sprays into both nostrils daily.   lisinopril -hydrochlorothiazide  (ZESTORETIC ) 20-12.5 MG tablet TAKE 2 TABLETS BY MOUTH DAILY   metoprolol  (TOPROL  XL) 200 MG 24 hr tablet Take 1 tablet (200 mg total) by mouth daily.   montelukast (SINGULAIR) 10 MG tablet TAKE 1 TABLET BY MOUTH EVERY MORNING   nitroGLYCERIN  (NITROSTAT ) 0.4 MG SL tablet Place 1 tablet (0.4 mg total) under the tongue every 5 (five) minutes as needed for chest pain.   oxyCODONE-acetaminophen (PERCOCET) 10-325 MG tablet Take 1 tablet by mouth every 4 (four) hours as needed for pain.   pantoprazole  (PROTONIX ) 40 MG tablet  Take 1 tablet (40 mg total) by mouth daily.   rosuvastatin  (CRESTOR ) 10 MG tablet Take 1 tablet (10 mg total) by mouth daily.   [DISCONTINUED] albuterol  (VENTOLIN  HFA) 108 (90 Base) MCG/ACT inhaler INHALE 1 PUFF EVERY 6 HOURS AS NEEDED   fluticasone-salmeterol (ADVAIR HFA) 115-21 MCG/ACT inhaler Inhale 2 puffs into the lungs 2 (two) times daily. (Patient not taking: Reported on 11/22/2023)   No facility-administered medications prior to visit.    Review of Systems  Constitutional:  Negative for weight loss (gained 3 lbs).  HENT: Negative.    Eyes: Negative.   Respiratory: Negative.    Cardiovascular: Negative.   Gastrointestinal: Negative.   Genitourinary: Negative.   Musculoskeletal:  Positive for back pain and joint pain.       Chronic pain  Skin: Negative.   Neurological: Negative.   Endo/Heme/Allergies: Negative.   Psychiatric/Behavioral: Negative.         Objective:   BP 120/86   Pulse 93   Temp 98.6 F (37 C)   Ht 5' 8 (1.727 m)   Wt 148 lb 6.4 oz (67.3 kg)   SpO2 96%   BMI 22.56 kg/m   Vitals:   11/22/23 1116  BP: 120/86  Pulse: 93  Temp: 98.6 F (37 C)  Height: 5' 8 (1.727 m)  Weight: 148 lb 6.4 oz (67.3 kg)  SpO2: 96%  BMI (Calculated): 22.57    Physical Exam Vitals and nursing note reviewed.  Constitutional:      Appearance: Normal appearance.  HENT:     Head: Normocephalic.     Left Ear: There is no impacted cerumen.     Nose: Nose normal.     Mouth/Throat:     Mouth: Mucous membranes are moist.     Pharynx: No posterior oropharyngeal erythema.  Eyes:     Extraocular Movements: Extraocular movements intact.     Pupils: Pupils are equal, round, and reactive to light.  Cardiovascular:     Rate and Rhythm: Regular rhythm.     Chest Wall: PMI is not displaced.     Pulses: Normal pulses.     Heart sounds: Normal heart sounds. No murmur heard. Pulmonary:     Effort: Pulmonary effort is normal.     Breath sounds: Normal air entry. No rhonchi  or rales.  Abdominal:     General: Abdomen is flat. Bowel sounds are normal. There is no distension.     Palpations: Abdomen is soft. There is no hepatomegaly, splenomegaly or mass.     Tenderness: There is no abdominal tenderness.  Musculoskeletal:        General: Normal range of motion.     Cervical back: Normal range of motion and neck supple.     Right lower  leg: No edema.     Left lower leg: No edema.  Skin:    General: Skin is warm and dry.  Neurological:     General: No focal deficit present.     Mental Status: He is alert and oriented to person, place, and time.     Cranial Nerves: No cranial nerve deficit.     Motor: No weakness.  Psychiatric:        Mood and Affect: Mood normal.        Behavior: Behavior normal.    Lipid Panel     Component Value Date/Time   CHOL 158 11/19/2023 0935   TRIG 263 (H) 11/19/2023 0935   HDL 50 11/19/2023 0935   CHOLHDL 3.2 11/19/2023 0935   LDLCALC 66 11/19/2023 0935   LABVLDL 42 (H) 11/19/2023 0935  CMP     Component Value Date/Time   NA 140 08/16/2023 0859   K 4.7 08/16/2023 0859   CL 101 08/16/2023 0859   CO2 22 08/16/2023 0859   GLUCOSE 126 (H) 08/16/2023 0859   BUN 13 08/16/2023 0859   CREATININE 1.01 08/16/2023 0859   CALCIUM  10.0 08/16/2023 0859   PROT 6.9 11/19/2023 0934   ALBUMIN 4.3 11/19/2023 0934   AST 43 (H) 11/19/2023 0934   ALT 15 11/19/2023 0934   ALKPHOS 66 11/19/2023 0934   BILITOT 0.3 11/19/2023 0934   EGFR 89 08/16/2023 0859     No results found for any visits on 11/22/23.      Assessment & Plan:  Herve was seen today for follow-up.  Vitamin B12 deficiency -     Vitamin B 12; Take 1,000 mcg by mouth daily in the afternoon.  Dispense: 180 tablet; Refill: 0 -     Vitamin B12  Moderate persistent asthma without complication -     Albuterol  Sulfate HFA; INHALE 1 PUFF EVERY 6 HOURS AS NEEDED  Dispense: 18 g; Refill: 5  Mixed hyperlipidemia -     Lipid panel -     Hepatic function  panel  Smoking cessation instruction/counseling given:  counseled patient on the dangers of tobacco use, advised patient to stop smoking, and reviewed strategies to maximize success Counseled to reduce etoh consumption.  Problem List Items Addressed This Visit       Other   Mixed hyperlipidemia   Relevant Orders   Hepatic function panel   Other Visit Diagnoses       Vitamin B12 deficiency    -  Primary   Relevant Medications   Cyanocobalamin (VITAMIN B 12) 500 MCG TABS   Other Relevant Orders   Vitamin B12     Moderate persistent asthma without complication       Relevant Medications   albuterol  (VENTOLIN  HFA) 108 (90 Base) MCG/ACT inhaler       Return in about 3 months (around 02/22/2024) for fu with labs prior.   Total time spent: 20 minutes  Sherrill Cinderella Perry, MD  11/22/2023   This document may have been prepared by [facility] Voice Recognition software and as such may include unintentional dictation errors.

## 2023-11-26 ENCOUNTER — Encounter: Payer: Self-pay | Admitting: Cardiovascular Disease

## 2023-11-26 ENCOUNTER — Ambulatory Visit: Admitting: Cardiovascular Disease

## 2023-11-26 VITALS — BP 120/74 | HR 96 | Ht 68.0 in | Wt 148.8 lb

## 2023-11-26 DIAGNOSIS — I340 Nonrheumatic mitral (valve) insufficiency: Secondary | ICD-10-CM

## 2023-11-26 DIAGNOSIS — E782 Mixed hyperlipidemia: Secondary | ICD-10-CM | POA: Diagnosis not present

## 2023-11-26 DIAGNOSIS — F17210 Nicotine dependence, cigarettes, uncomplicated: Secondary | ICD-10-CM | POA: Diagnosis not present

## 2023-11-26 DIAGNOSIS — I10 Essential (primary) hypertension: Secondary | ICD-10-CM

## 2023-11-26 DIAGNOSIS — K219 Gastro-esophageal reflux disease without esophagitis: Secondary | ICD-10-CM

## 2023-11-26 DIAGNOSIS — I4711 Inappropriate sinus tachycardia, so stated: Secondary | ICD-10-CM

## 2023-11-26 DIAGNOSIS — R7303 Prediabetes: Secondary | ICD-10-CM

## 2023-11-26 DIAGNOSIS — I2510 Atherosclerotic heart disease of native coronary artery without angina pectoris: Secondary | ICD-10-CM

## 2023-11-26 DIAGNOSIS — R0602 Shortness of breath: Secondary | ICD-10-CM

## 2023-11-26 MED ORDER — CLOPIDOGREL BISULFATE 75 MG PO TABS: 75.0000 mg | ORAL_TABLET | Freq: Once | ORAL | 3 refills | Status: AC

## 2023-11-26 MED ORDER — PANTOPRAZOLE SODIUM 40 MG PO TBEC: 40.0000 mg | DELAYED_RELEASE_TABLET | Freq: Every day | ORAL | 11 refills | Status: AC

## 2023-11-26 NOTE — Progress Notes (Signed)
 Cardiology Office Note   Date:  11/26/2023   ID:  Jesse Nelson, Jesse Nelson December 10, 1970, MRN [id number]  PCP:  [name], MD  Cardiologist:  [name], MD      History of Present Illness: Jesse Nelson is a 53 y.o. male who presents for  Chief Complaint  Patient presents with   Follow-up    3 month follow up    No chest pain or SOB.      Past Medical History:  Diagnosis Date   Asthma    Coronary artery disease    Dysrhythmia    GERD (gastroesophageal reflux disease)    Hyperlipidemia    Hypertension      Past Surgical History:  Procedure Laterality Date   COLONOSCOPY N/A 09/09/2023   Procedure: COLONOSCOPY;  Surgeon: Onita Elspeth Sharper, DO;  Location: [facility] ENDOSCOPY;  Service: Gastroenterology;  Laterality: N/A;  Patient is on Plavix    ESOPHAGOGASTRODUODENOSCOPY N/A 09/09/2023   Procedure: EGD (ESOPHAGOGASTRODUODENOSCOPY);  Surgeon: Onita Elspeth Sharper, DO;  Location: [facility] ENDOSCOPY;  Service: Gastroenterology;  Laterality: N/A;   shoulder spur removed Left      Current Outpatient Medications  Medication Sig Dispense Refill   albuterol  (VENTOLIN  HFA) 108 (90 Base) MCG/ACT inhaler INHALE 1 PUFF EVERY 6 HOURS AS NEEDED 18 g 5   amLODipine  (NORVASC ) 5 MG tablet TAKE 1 TABLET(5 MG) BY MOUTH EVERY MORNING 90 tablet 2   clopidogrel  (PLAVIX ) 75 MG tablet TAKE 1 TABLET(75 MG) BY MOUTH 1 TIME FOR 1 DOSE 30 tablet 0   Cyanocobalamin (VITAMIN B 12) 500 MCG TABS Take 1,000 mcg by mouth daily in the afternoon. 180 tablet 0   famotidine  (PEPCID ) 20 MG tablet Take 1 tablet (20 mg total) by mouth 2 (two) times daily. 90 tablet 1   fluticasone (FLONASE) 50 MCG/ACT nasal spray Place 2 sprays into both nostrils daily. (Patient taking differently: Place 2 sprays into both nostrils as needed.)     lisinopril -hydrochlorothiazide  (ZESTORETIC ) 20-12.5 MG tablet TAKE 2 TABLETS BY MOUTH DAILY 180 tablet 0   metoprolol  (TOPROL  XL) 200 MG 24 hr tablet Take 1 tablet (200 mg  total) by mouth daily. 90 tablet 3   montelukast (SINGULAIR) 10 MG tablet TAKE 1 TABLET BY MOUTH EVERY MORNING 90 tablet 1   nitroGLYCERIN  (NITROSTAT ) 0.4 MG SL tablet Place 1 tablet (0.4 mg total) under the tongue every 5 (five) minutes as needed for chest pain. 100 tablet 3   oxyCODONE-acetaminophen (PERCOCET) 10-325 MG tablet Take 1 tablet by mouth every 4 (four) hours as needed for pain.     pantoprazole  (PROTONIX ) 40 MG tablet Take 1 tablet (40 mg total) by mouth daily. 30 tablet 11   rosuvastatin  (CRESTOR ) 10 MG tablet Take 1 tablet (10 mg total) by mouth daily. 90 tablet 1   fluticasone-salmeterol (ADVAIR HFA) 115-21 MCG/ACT inhaler Inhale 2 puffs into the lungs 2 (two) times daily. (Patient not taking: Reported on 11/26/2023)     No current facility-administered medications for this visit.    Allergies:   Patient has no known allergies.    Social History:   reports that he has been smoking cigarettes. He has a 26 pack-year smoking history. He has never used smokeless tobacco. He reports that he does not currently use drugs. No history on file for alcohol use.   Family History:  family history is not on file.    ROS:     Review of Systems  Constitutional: Negative.   HENT: Negative.  Eyes: Negative.   Respiratory: Negative.    Gastrointestinal: Negative.   Genitourinary: Negative.   Musculoskeletal: Negative.   Skin: Negative.   Neurological: Negative.   Endo/Heme/Allergies: Negative.   Psychiatric/Behavioral: Negative.    All other systems reviewed and are negative.     All other systems are reviewed and negative.    PHYSICAL EXAM: VS:  BP 120/74   Pulse 96   Ht 5' 8 (1.727 m)   Wt 148 lb 12.8 oz (67.5 kg)   SpO2 97%   BMI 22.62 kg/m  , BMI Body mass index is 22.62 kg/m. Last weight:  Wt Readings from Last 3 Encounters:  11/26/23 148 lb 12.8 oz (67.5 kg)  11/22/23 148 lb 6.4 oz (67.3 kg)  09/09/23 146 lb (66.2 kg)     Physical Exam Vitals reviewed.   Constitutional:      Appearance: Normal appearance. He is normal weight.  HENT:     Head: Normocephalic.     Nose: Nose normal.     Mouth/Throat:     Mouth: Mucous membranes are moist.  Eyes:     Pupils: Pupils are equal, round, and reactive to light.  Cardiovascular:     Rate and Rhythm: Normal rate and regular rhythm.     Pulses: Normal pulses.     Heart sounds: Normal heart sounds.  Pulmonary:     Effort: Pulmonary effort is normal.  Abdominal:     General: Abdomen is flat. Bowel sounds are normal.  Musculoskeletal:        General: Normal range of motion.     Cervical back: Normal range of motion.  Skin:    General: Skin is warm.  Neurological:     General: No focal deficit present.     Mental Status: He is alert.  Psychiatric:        Mood and Affect: Mood normal.       EKG:   Recent Labs: 08/16/2023: BUN 13; Creatinine, Ser 1.01; Hemoglobin 13.9; Platelets 286; Potassium 4.7; Sodium 140 11/19/2023: ALT 15    Lipid Panel    Component Value Date/Time   CHOL 158 11/19/2023 0935   TRIG 263 (H) 11/19/2023 0935   HDL 50 11/19/2023 0935   CHOLHDL 3.2 11/19/2023 0935   LDLCALC 66 11/19/2023 0935      Other studies Reviewed: Additional studies/ records that were reviewed today include:  Review of the above records demonstrates:       No data to display            ASSESSMENT AND PLAN:    ICD-10-CM   1. 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 GERD without esophagitis  K21.9    start protonix  as has heart burn    3. Nonrheumatic mitral valve regurgitation  I34.0     4. SOB (shortness of breath)  R06.02     5. Coronary artery disease involving native coronary artery of native heart without angina pectoris  I25.10     6. Mixed hyperlipidemia  E78.2        Problem List Items Addressed This Visit       Digestive   GERD without esophagitis     Other   Mixed hyperlipidemia   Other Visit Diagnoses       Inappropriate sinus  tachycardia (HCC)         Nonrheumatic mitral valve regurgitation         SOB (shortness of breath)  Coronary artery disease involving native coronary artery of native heart without angina pectoris              Disposition:   No follow-ups on file.    Total time spent: 40 minutes  Signed,  [name], MD  11/26/2023 11:01 AM    [facility]

## 2023-12-27 ENCOUNTER — Emergency Department

## 2023-12-27 ENCOUNTER — Inpatient Hospital Stay
Admission: EM | Admit: 2023-12-27 | Discharge: 2023-12-30 | DRG: 683 | Disposition: A | Discharge status: Home / Self Care | Attending: Internal Medicine | Admitting: Internal Medicine

## 2023-12-27 ENCOUNTER — Other Ambulatory Visit: Payer: Self-pay

## 2023-12-27 DIAGNOSIS — Z7902 Long term (current) use of antithrombotics/antiplatelets: Secondary | ICD-10-CM

## 2023-12-27 DIAGNOSIS — R197 Diarrhea, unspecified: Secondary | ICD-10-CM | POA: Diagnosis present

## 2023-12-27 DIAGNOSIS — R0602 Shortness of breath: Secondary | ICD-10-CM

## 2023-12-27 DIAGNOSIS — J4520 Mild intermittent asthma, uncomplicated: Secondary | ICD-10-CM | POA: Diagnosis not present

## 2023-12-27 DIAGNOSIS — F17210 Nicotine dependence, cigarettes, uncomplicated: Secondary | ICD-10-CM | POA: Diagnosis present

## 2023-12-27 DIAGNOSIS — Z809 Family history of malignant neoplasm, unspecified: Secondary | ICD-10-CM

## 2023-12-27 DIAGNOSIS — M6282 Rhabdomyolysis: Secondary | ICD-10-CM | POA: Diagnosis present

## 2023-12-27 DIAGNOSIS — Z833 Family history of diabetes mellitus: Secondary | ICD-10-CM

## 2023-12-27 DIAGNOSIS — Z823 Family history of stroke: Secondary | ICD-10-CM | POA: Diagnosis not present

## 2023-12-27 DIAGNOSIS — K219 Gastro-esophageal reflux disease without esophagitis: Secondary | ICD-10-CM | POA: Diagnosis present

## 2023-12-27 DIAGNOSIS — E860 Dehydration: Secondary | ICD-10-CM | POA: Diagnosis present

## 2023-12-27 DIAGNOSIS — I4711 Inappropriate sinus tachycardia, so stated: Secondary | ICD-10-CM

## 2023-12-27 DIAGNOSIS — N179 Acute kidney failure, unspecified: Secondary | ICD-10-CM | POA: Diagnosis present

## 2023-12-27 DIAGNOSIS — J45909 Unspecified asthma, uncomplicated: Secondary | ICD-10-CM | POA: Diagnosis present

## 2023-12-27 DIAGNOSIS — Z23 Encounter for immunization: Secondary | ICD-10-CM | POA: Diagnosis present

## 2023-12-27 DIAGNOSIS — E782 Mixed hyperlipidemia: Secondary | ICD-10-CM

## 2023-12-27 DIAGNOSIS — R7303 Prediabetes: Secondary | ICD-10-CM

## 2023-12-27 DIAGNOSIS — Z79899 Other long term (current) drug therapy: Secondary | ICD-10-CM | POA: Diagnosis not present

## 2023-12-27 DIAGNOSIS — D649 Anemia, unspecified: Secondary | ICD-10-CM | POA: Diagnosis present

## 2023-12-27 DIAGNOSIS — E8721 Acute metabolic acidosis: Secondary | ICD-10-CM | POA: Diagnosis present

## 2023-12-27 DIAGNOSIS — Z7982 Long term (current) use of aspirin: Secondary | ICD-10-CM

## 2023-12-27 DIAGNOSIS — E876 Hypokalemia: Secondary | ICD-10-CM | POA: Diagnosis present

## 2023-12-27 DIAGNOSIS — Z1152 Encounter for screening for COVID-19: Secondary | ICD-10-CM

## 2023-12-27 DIAGNOSIS — I10 Essential (primary) hypertension: Secondary | ICD-10-CM | POA: Diagnosis present

## 2023-12-27 DIAGNOSIS — I2510 Atherosclerotic heart disease of native coronary artery without angina pectoris: Secondary | ICD-10-CM | POA: Diagnosis present

## 2023-12-27 DIAGNOSIS — I959 Hypotension, unspecified: Secondary | ICD-10-CM | POA: Diagnosis present

## 2023-12-27 DIAGNOSIS — Z8249 Family history of ischemic heart disease and other diseases of the circulatory system: Secondary | ICD-10-CM | POA: Diagnosis not present

## 2023-12-27 DIAGNOSIS — E785 Hyperlipidemia, unspecified: Secondary | ICD-10-CM | POA: Diagnosis present

## 2023-12-27 DIAGNOSIS — I340 Nonrheumatic mitral (valve) insufficiency: Secondary | ICD-10-CM

## 2023-12-27 LAB — COMPREHENSIVE METABOLIC PANEL WITH GFR
ALT: 7 U/L (ref 0–44)
AST: 14 U/L — ABNORMAL LOW (ref 15–41)
Albumin: 3.1 g/dL — ABNORMAL LOW (ref 3.5–5.0)
Alkaline Phosphatase: 47 U/L (ref 38–126)
Anion gap: 27 — ABNORMAL HIGH (ref 5–15)
BUN: 97 mg/dL — ABNORMAL HIGH (ref 6–20)
CO2: 11 mmol/L — ABNORMAL LOW (ref 22–32)
Calcium: 7.6 mg/dL — ABNORMAL LOW (ref 8.9–10.3)
Chloride: 92 mmol/L — ABNORMAL LOW (ref 98–111)
Creatinine, Ser: 14.21 mg/dL — ABNORMAL HIGH (ref 0.61–1.24)
GFR, Estimated: 4 mL/min — ABNORMAL LOW (ref >60)
Glucose, Bld: 122 mg/dL — ABNORMAL HIGH (ref 70–99)
Potassium: 3.5 mmol/L (ref 3.5–5.1)
Sodium: 130 mmol/L — ABNORMAL LOW (ref 135–145)
Total Bilirubin: 0.7 mg/dL (ref 0.0–1.2)
Total Protein: 7.1 g/dL (ref 6.5–8.1)

## 2023-12-27 LAB — RESP PANEL BY RT-PCR (RSV, FLU A&B, COVID)  RVPGX2
Influenza A by PCR: NEGATIVE
Influenza B by PCR: NEGATIVE
Resp Syncytial Virus by PCR: NEGATIVE
SARS Coronavirus 2 by RT PCR: NEGATIVE

## 2023-12-27 LAB — CBC
HCT: 35.9 % — ABNORMAL LOW (ref 39.0–52.0)
Hemoglobin: 13.1 g/dL (ref 13.0–17.0)
MCH: 31.8 pg (ref 26.0–34.0)
MCHC: 36.5 g/dL — ABNORMAL HIGH (ref 30.0–36.0)
MCV: 87.1 fL (ref 80.0–100.0)
Platelets: 257 K/uL (ref 150–400)
RBC: 4.12 MIL/uL — ABNORMAL LOW (ref 4.22–5.81)
RDW: 12.6 % (ref 11.5–15.5)
WBC: 7.4 K/uL (ref 4.0–10.5)
nRBC: 0 % (ref 0.0–0.2)

## 2023-12-27 LAB — CK: Total CK: 439 U/L — ABNORMAL HIGH (ref 49–397)

## 2023-12-27 LAB — TROPONIN I (HIGH SENSITIVITY): Troponin I (High Sensitivity): 4 ng/L (ref <18)

## 2023-12-27 MED ORDER — SODIUM CHLORIDE 0.9 % IV SOLN: Freq: Once | INTRAVENOUS | Status: AC

## 2023-12-27 MED ORDER — SODIUM CHLORIDE 0.9 % IV BOLUS
1000.0000 mL | Freq: Once | INTRAVENOUS | Status: AC
Start: 2023-12-27 — End: 2023-12-27
  Administered 2023-12-27: 1000 mL via INTRAVENOUS

## 2023-12-27 MED ORDER — SODIUM CHLORIDE 0.9 % IV BOLUS
500.0000 mL | INTRAVENOUS | Status: AC
  Administered 2023-12-27: 500 mL via INTRAVENOUS

## 2023-12-27 NOTE — ED Provider Notes (Signed)
 [facility] Provider Note    Event Date/Time   First MD Initiated Contact with Patient 12/27/23 1642     (approximate)  History   Chief Complaint: Weakness  HPI  Jesse Nelson is a 53 y.o. male with a past medical history of asthma, gastric reflux, hypertension, hyperlipidemia, presents to the emergency department for generalized weakness nausea vomiting diarrhea.  According to the patient for the past 5 or 6 days he has been feeling very weak he has been experiencing nausea vomiting diarrhea.  Patient states he is a daily alcohol drinker usually drinks between 6 and 8 beers daily.  Has not drinking in approximately 1 week due to not feeling well.  Denies any known fever.  States he has not urinated over the last 3 days he has been trying to drink fluids but has been experiencing significant amounts of diarrhea per patient.  Denies any cough or congestion.  States he was having some chest pain days ago but denies any currently.  EMS gave 1 L of fluids prior to arrival, blood pressure currently 102/73.  Physical Exam   Triage Vital Signs: ED Triage Vitals  Encounter Vitals Group     BP 12/27/23 1649 102/73     Girls Systolic BP Percentile --      Girls Diastolic BP Percentile --      Boys Systolic BP Percentile --      Boys Diastolic BP Percentile --      Pulse Rate 12/27/23 1649 80     Resp 12/27/23 1649 18     Temp 12/27/23 1649 97.8 F (36.6 C)     Temp Source 12/27/23 1649 Oral     SpO2 12/27/23 1649 100 %     Weight 12/27/23 1648 136 lb (61.7 kg)     Height 12/27/23 1648 5' 8 (1.727 m)     Head Circumference --      Peak Flow --      Pain Score 12/27/23 1648 0     Pain Loc --      Pain Education --      Exclude from Growth Chart --     Most recent vital signs: Vitals:   12/27/23 1649  BP: 102/73  Pulse: 80  Resp: 18  Temp: 97.8 F (36.6 C)  SpO2: 100%    General: Awake, no distress.  Dry appearing mucous membranes. CV:  Good  peripheral perfusion.  Regular rate and rhythm  Resp:  Normal effort.  Equal breath sounds bilaterally.  Abd:  Minimal abdominal distention but soft and nontender to palpation.  Benign abdomen.  ED Results / Procedures / Treatments   EKG  EKG viewed and interpreted by myself shows a normal sinus rhythm at 74 bpm with a narrow QRS, normal axis, normal intervals, no concerning ST changes.  RADIOLOGY  I have reviewed interpret the chest x-ray images.  No consolidation on my evaluation. Radiology has read the x-ray as negative   MEDICATIONS ORDERED IN ED: Medications  sodium chloride  0.9 % bolus 1,000 mL (1,000 mLs Intravenous New Bag/Given 12/27/23 1731)     IMPRESSION / MDM / ASSESSMENT AND PLAN / ED COURSE  I reviewed the triage vital signs and the nursing notes.  Patient's presentation is most consistent with acute presentation with potential threat to life or bodily function.  Patient presents to the emergency department for generalized weakness fatigue has not been able to ambulate without assistance over the last few days.  Has  not urinated in 3 days per patient.  No significant amount of urine on bladder scan per nurse.  Blood pressure was lower with EMS currently 102/70 after 1 L bolus.  We will IV hydrate with additional fluids.  Will check labs including CBC chemistry CK given the patient's chest pain complaint days earlier we will obtain a troponin as well.  Given the diarrhea and nausea we will obtain a COVID swab.  Will obtain a urinalysis once the patient is able to urinate.  Will continue to closely monitor while awaiting results.  Patient agreeable to plan of care.  Patient's respiratory panel is negative.  CBC reassuring.  Chemistry shows acute renal failure with a creatinine of 14.2 and an anion gap of 27.  Patient CK elevated to 439.  Troponin reassuringly negative.  CT scan of the abdomen and pelvis has been completing showing no acute finding.  Chest x-ray is negative.   Given the patient's acute renal failure I did discuss with nephrology.  They recommend admission to the hospital for continued IV hydration, they will follow along to see if the patient may require dialysis.  FINAL CLINICAL IMPRESSION(S) / ED DIAGNOSES   Generalized weakness Dehydration Nausea vomiting Diarrhea  Note:  This document was prepared using Dragon voice recognition software and may include unintentional dictation errors.   Dorothyann Drivers, MD 12/27/23 2120

## 2023-12-27 NOTE — ED Notes (Signed)
 Pt returned from xray

## 2023-12-27 NOTE — ED Notes (Signed)
 Patient transported to CT

## 2023-12-27 NOTE — ED Triage Notes (Signed)
 N/V/D for the last five days. Pt sts that he has not urinated in the last three days. Pt sts before he stopped urinating his urine was dark like tea. Pt abd is distended. Per EMS pt was hypotensive on their arrival. EMS gave 1L of LR enroute to ED via 20g in the RFA with 4mg  of zofran .

## 2023-12-27 NOTE — ED Notes (Signed)
 Pt ambulated to toilet with minimal assistance. Pt attempted to urinate, but was unable to at this time.

## 2023-12-27 NOTE — ED Notes (Addendum)
 Patient transported to X-ray

## 2023-12-27 NOTE — ED Notes (Signed)
 Bladder scan revealed 0mL urine in bladder

## 2023-12-28 DIAGNOSIS — I10 Essential (primary) hypertension: Secondary | ICD-10-CM | POA: Insufficient documentation

## 2023-12-28 DIAGNOSIS — J45909 Unspecified asthma, uncomplicated: Secondary | ICD-10-CM | POA: Insufficient documentation

## 2023-12-28 DIAGNOSIS — E785 Hyperlipidemia, unspecified: Secondary | ICD-10-CM | POA: Insufficient documentation

## 2023-12-28 DIAGNOSIS — N179 Acute kidney failure, unspecified: Secondary | ICD-10-CM | POA: Diagnosis not present

## 2023-12-28 LAB — URINALYSIS, COMPLETE (UACMP) WITH MICROSCOPIC
Bilirubin Urine: NEGATIVE
Glucose, UA: NEGATIVE mg/dL
Ketones, ur: NEGATIVE mg/dL
Leukocytes,Ua: NEGATIVE
Nitrite: NEGATIVE
Protein, ur: 100 mg/dL — AB
Specific Gravity, Urine: 1.013 (ref 1.005–1.030)
pH: 5 (ref 5.0–8.0)

## 2023-12-28 LAB — BASIC METABOLIC PANEL WITH GFR
Anion gap: 17 — ABNORMAL HIGH (ref 5–15)
BUN: 83 mg/dL — ABNORMAL HIGH (ref 6–20)
CO2: 12 mmol/L — ABNORMAL LOW (ref 22–32)
Calcium: 5.4 mg/dL — CL (ref 8.9–10.3)
Chloride: 107 mmol/L (ref 98–111)
Creatinine, Ser: 11.88 mg/dL — ABNORMAL HIGH (ref 0.61–1.24)
GFR, Estimated: 5 mL/min — ABNORMAL LOW (ref >60)
Glucose, Bld: 73 mg/dL (ref 70–99)
Potassium: 3 mmol/L — ABNORMAL LOW (ref 3.5–5.1)
Sodium: 136 mmol/L (ref 135–145)

## 2023-12-28 LAB — POTASSIUM: Potassium: 3.7 mmol/L (ref 3.5–5.1)

## 2023-12-28 LAB — CBC
HCT: 29 % — ABNORMAL LOW (ref 39.0–52.0)
Hemoglobin: 10.3 g/dL — ABNORMAL LOW (ref 13.0–17.0)
MCH: 31.5 pg (ref 26.0–34.0)
MCHC: 35.5 g/dL (ref 30.0–36.0)
MCV: 88.7 fL (ref 80.0–100.0)
Platelets: 221 K/uL (ref 150–400)
RBC: 3.27 MIL/uL — ABNORMAL LOW (ref 4.22–5.81)
RDW: 12.7 % (ref 11.5–15.5)
WBC: 5.9 K/uL (ref 4.0–10.5)
nRBC: 0 % (ref 0.0–0.2)

## 2023-12-28 LAB — HIV ANTIBODY (ROUTINE TESTING W REFLEX): HIV Screen 4th Generation wRfx: NONREACTIVE

## 2023-12-28 LAB — MAGNESIUM: Magnesium: 1.7 mg/dL (ref 1.7–2.4)

## 2023-12-28 LAB — ALBUMIN: Albumin: 2.2 g/dL — ABNORMAL LOW (ref 3.5–5.0)

## 2023-12-28 LAB — PHOSPHORUS: Phosphorus: 8 mg/dL — ABNORMAL HIGH (ref 2.5–4.6)

## 2023-12-28 MED ORDER — PANTOPRAZOLE SODIUM 40 MG PO TBEC
40.0000 mg | DELAYED_RELEASE_TABLET | Freq: Every day | ORAL | Status: DC
  Administered 2023-12-28 – 2023-12-30 (×3): 40 mg via ORAL
  Filled 2023-12-28 (×3): qty 1

## 2023-12-28 MED ORDER — CYANOCOBALAMIN 500 MCG PO TABS
1000.0000 ug | ORAL_TABLET | Freq: Every day | ORAL | Status: DC
  Administered 2023-12-28 – 2023-12-30 (×3): 1000 ug via ORAL
  Filled 2023-12-28 (×3): qty 2

## 2023-12-28 MED ORDER — AMLODIPINE BESYLATE 5 MG PO TABS: 5.0000 mg | ORAL_TABLET | Freq: Every day | ORAL | Status: DC

## 2023-12-28 MED ORDER — ASPIRIN 81 MG PO CHEW
324.0000 mg | CHEWABLE_TABLET | Freq: Once | ORAL | Status: AC
  Administered 2023-12-28: 324 mg via ORAL
  Filled 2023-12-28: qty 4

## 2023-12-28 MED ORDER — ONDANSETRON HCL 4 MG/2ML IJ SOLN
4.0000 mg | Freq: Four times a day (QID) | INTRAMUSCULAR | Status: DC | PRN
  Administered 2023-12-29: 4 mg via INTRAVENOUS
  Filled 2023-12-28: qty 2

## 2023-12-28 MED ORDER — THIAMINE MONONITRATE 100 MG PO TABS
100.0000 mg | ORAL_TABLET | Freq: Every day | ORAL | Status: DC
  Administered 2023-12-28 – 2023-12-30 (×3): 100 mg via ORAL
  Filled 2023-12-28 (×3): qty 1

## 2023-12-28 MED ORDER — OXYCODONE-ACETAMINOPHEN 5-325 MG PO TABS
1.0000 | ORAL_TABLET | ORAL | Status: DC | PRN
  Administered 2023-12-28 – 2023-12-30 (×4): 1 via ORAL
  Filled 2023-12-28 (×4): qty 1

## 2023-12-28 MED ORDER — OXYCODONE-ACETAMINOPHEN 10-325 MG PO TABS: 1.0000 | ORAL_TABLET | ORAL | Status: DC | PRN

## 2023-12-28 MED ORDER — ROSUVASTATIN CALCIUM 10 MG PO TABS
10.0000 mg | ORAL_TABLET | Freq: Every day | ORAL | Status: DC
Start: 2023-12-28 — End: 2023-12-28
  Administered 2023-12-28: 10 mg via ORAL
  Filled 2023-12-28: qty 1

## 2023-12-28 MED ORDER — METOPROLOL SUCCINATE ER 50 MG PO TB24
200.0000 mg | ORAL_TABLET | Freq: Every day | ORAL | Status: DC
  Filled 2023-12-28: qty 4

## 2023-12-28 MED ORDER — LACTATED RINGERS IV SOLN: INTRAVENOUS | Status: DC

## 2023-12-28 MED ORDER — FAMOTIDINE 20 MG PO TABS
20.0000 mg | ORAL_TABLET | Freq: Two times a day (BID) | ORAL | Status: DC
Start: 2023-12-28 — End: 2023-12-29
  Administered 2023-12-28 – 2023-12-29 (×3): 20 mg via ORAL
  Filled 2023-12-28 (×3): qty 1

## 2023-12-28 MED ORDER — LISINOPRIL-HYDROCHLOROTHIAZIDE 20-12.5 MG PO TABS: 2.0000 | ORAL_TABLET | Freq: Every day | ORAL | Status: DC

## 2023-12-28 MED ORDER — SODIUM CHLORIDE 0.9 % IV SOLN
INTRAVENOUS | Status: DC
Start: 2023-12-28 — End: 2023-12-28

## 2023-12-28 MED ORDER — ACETAMINOPHEN 325 MG PO TABS: 650.0000 mg | ORAL_TABLET | Freq: Four times a day (QID) | ORAL | Status: DC | PRN

## 2023-12-28 MED ORDER — ACETAMINOPHEN 650 MG RE SUPP: 650.0000 mg | Freq: Four times a day (QID) | RECTAL | Status: DC | PRN

## 2023-12-28 MED ORDER — TRAZODONE HCL 50 MG PO TABS: 25.0000 mg | ORAL_TABLET | Freq: Every evening | ORAL | Status: DC | PRN

## 2023-12-28 MED ORDER — ALBUTEROL SULFATE (2.5 MG/3ML) 0.083% IN NEBU: 2.5000 mg | INHALATION_SOLUTION | RESPIRATORY_TRACT | Status: DC | PRN

## 2023-12-28 MED ORDER — HEPARIN SODIUM (PORCINE) 5000 UNIT/ML IJ SOLN
5000.0000 [IU] | Freq: Three times a day (TID) | INTRAMUSCULAR | Status: DC
  Administered 2023-12-28 – 2023-12-30 (×8): 5000 [IU] via SUBCUTANEOUS
  Filled 2023-12-28 (×8): qty 1

## 2023-12-28 MED ORDER — MAGNESIUM HYDROXIDE 400 MG/5ML PO SUSP: 30.0000 mL | Freq: Every day | ORAL | Status: DC | PRN

## 2023-12-28 MED ORDER — SODIUM CHLORIDE 0.9 % IV BOLUS
500.0000 mL | Freq: Once | INTRAVENOUS | Status: AC
  Administered 2023-12-28: 500 mL via INTRAVENOUS

## 2023-12-28 MED ORDER — INFLUENZA VIRUS VACC SPLIT PF (FLUZONE) 0.5 ML IM SUSY
0.5000 mL | PREFILLED_SYRINGE | INTRAMUSCULAR | Status: AC
  Administered 2023-12-30: 0.5 mL via INTRAMUSCULAR
  Filled 2023-12-28: qty 0.5

## 2023-12-28 MED ORDER — CLOPIDOGREL BISULFATE 75 MG PO TABS
75.0000 mg | ORAL_TABLET | Freq: Every day | ORAL | Status: DC
  Administered 2023-12-28 – 2023-12-30 (×3): 75 mg via ORAL
  Filled 2023-12-28 (×3): qty 1

## 2023-12-28 MED ORDER — SODIUM CHLORIDE 0.9 % IV SOLN: INTRAVENOUS | Status: DC

## 2023-12-28 MED ORDER — MONTELUKAST SODIUM 10 MG PO TABS
10.0000 mg | ORAL_TABLET | Freq: Every morning | ORAL | Status: DC
  Administered 2023-12-28 – 2023-12-30 (×3): 10 mg via ORAL
  Filled 2023-12-28 (×3): qty 1

## 2023-12-28 MED ORDER — NITROGLYCERIN 0.4 MG SL SUBL: 0.4000 mg | SUBLINGUAL_TABLET | SUBLINGUAL | Status: DC | PRN

## 2023-12-28 MED ORDER — CALCIUM GLUCONATE-NACL 2-0.675 GM/100ML-% IV SOLN
2.0000 g | Freq: Once | INTRAVENOUS | Status: AC
  Administered 2023-12-28: 2000 mg via INTRAVENOUS
  Filled 2023-12-28: qty 100

## 2023-12-28 MED ORDER — ONDANSETRON HCL 4 MG PO TABS: 4.0000 mg | ORAL_TABLET | Freq: Four times a day (QID) | ORAL | Status: DC | PRN

## 2023-12-28 MED ORDER — OXYCODONE HCL 5 MG PO TABS
5.0000 mg | ORAL_TABLET | ORAL | Status: DC | PRN
Start: 2023-12-28 — End: 2023-12-30
  Administered 2023-12-29 – 2023-12-30 (×3): 5 mg via ORAL
  Filled 2023-12-28 (×3): qty 1

## 2023-12-28 MED ORDER — POTASSIUM CHLORIDE CRYS ER 20 MEQ PO TBCR
40.0000 meq | EXTENDED_RELEASE_TABLET | ORAL | Status: AC
  Administered 2023-12-28: 40 meq via ORAL
  Filled 2023-12-28: qty 2

## 2023-12-28 MED ORDER — SODIUM BICARBONATE 650 MG PO TABS
1300.0000 mg | ORAL_TABLET | Freq: Two times a day (BID) | ORAL | Status: DC
  Administered 2023-12-28 – 2023-12-30 (×5): 1300 mg via ORAL
  Filled 2023-12-28 (×5): qty 2

## 2023-12-28 MED ORDER — FOLIC ACID 1 MG PO TABS
1.0000 mg | ORAL_TABLET | Freq: Every day | ORAL | Status: DC
  Administered 2023-12-28 – 2023-12-30 (×3): 1 mg via ORAL
  Filled 2023-12-28 (×3): qty 1

## 2023-12-28 MED ORDER — ADULT MULTIVITAMIN W/MINERALS CH
1.0000 | ORAL_TABLET | Freq: Every day | ORAL | Status: DC
  Administered 2023-12-28 – 2023-12-30 (×3): 1 via ORAL
  Filled 2023-12-28 (×3): qty 1

## 2023-12-28 NOTE — ED Notes (Signed)
 Pt up to side of bed eating breakfast tray. No complaints at this time.

## 2023-12-28 NOTE — Progress Notes (Signed)
 Patient refused 2nd round of potassium 40mEq pills time now.  Will continue to monitor.

## 2023-12-28 NOTE — Assessment & Plan Note (Signed)
-   Will continue antihypertensive therapy.

## 2023-12-28 NOTE — Assessment & Plan Note (Signed)
-   Will continue Singulair  and as needed albuterol .

## 2023-12-28 NOTE — Plan of Care (Signed)
  Problem: Clinical Measurements: Goal: Ability to maintain clinical measurements within normal limits will improve Outcome: Progressing   Problem: Pain Managment: Goal: General experience of comfort will improve and/or be controlled Outcome: Progressing   Problem: Safety: Goal: Ability to remain free from injury will improve Outcome: Progressing

## 2023-12-28 NOTE — ED Notes (Signed)
 Pt independently ambulated with steady gait to restroom & back to treatment room. Pt denies any complaints at this time & states he feels pretty good.

## 2023-12-28 NOTE — Assessment & Plan Note (Signed)
-   This is likely prerenal secondary to severe dehydration. - The patient will be admitted to a progressive unit bed. - Will continue hydration with IV normal saline. - Will follow BMPs. - Will avoid nephrotoxins.

## 2023-12-28 NOTE — Consult Note (Signed)
 [facility]  CONSULT NOTE    Date: 12/28/2023                  Patient Name:  Jesse Nelson  MRN: [id number]  DOB: January 02, 1971  Age / Sex: 53 y.o., male         PCP: [name], MD                 Service Requesting Consult: TRH                 Reason for Consult: Acute kidney injury            History of Present Illness: Mr. Jesse Nelson is a 53 y.o.  male with past medical conditions including CAD, hypertension, GERD, and dyslipidemia, who was admitted to [facility] on 12/27/2023 for AKI (acute kidney injury) [N17.9] Acute renal failure, unspecified acute renal failure type [N17.9]  Patient presents to the emergency department complaining of abdominal pain with associated symptoms of nausea, vomiting, and diarrhea.  Patient states the symptoms began on Wednesday and have only progressed since that time.  Patient reports poor oral intake during this time as well.  Denies shortness of breath or cough.  Does report he consumes alcohol, 6-8 beers a day.  Has not been able to maintain this due to nausea.  Also states he has not urinated in 3 days prior to ED arrival.  Reports intermittent NSAID use, ibuprofen.  States he does see pain management and is prescribed ibuprofen and oxycodone  however only takes ibuprofen as needed.  Labs on ED arrival include sodium 130, BUN 97, serum bicarb 11, creatinine 14.21 with GFR 4, calcium  7.6.  Respiratory panel negative for influenza, COVID-19, and RSV.  UA appears hazy with mild proteinuria and bacteria.  Chest x-ray negative for acute findings.  CT renal stone negative for hydronephrosis.  Medications: Outpatient medications: Medications Prior to Admission  Medication Sig Dispense Refill Last Dose/Taking   albuterol  (VENTOLIN  HFA) 108 (90 Base) MCG/ACT inhaler INHALE 1 PUFF EVERY 6 HOURS AS NEEDED 18 g 5 Taking   amLODipine  (NORVASC ) 5 MG tablet TAKE 1 TABLET(5 MG) BY MOUTH EVERY MORNING 90 tablet 2 12/26/2023   aspirin   81 MG chewable tablet Chew 324 mg by mouth once.   12/27/2023   clopidogrel  (PLAVIX ) 75 MG tablet Take 75 mg by mouth daily.   12/26/2023   Cyanocobalamin  (VITAMIN B 12) 500 MCG TABS Take 1,000 mcg by mouth daily in the afternoon. 180 tablet 0 12/26/2023   famotidine  (PEPCID ) 20 MG tablet Take 1 tablet (20 mg total) by mouth 2 (two) times daily. 90 tablet 1 12/26/2023   fluticasone (FLONASE) 50 MCG/ACT nasal spray Place 2 sprays into both nostrils daily. (Patient taking differently: Place 2 sprays into both nostrils as needed.)   Taking Differently   ibuprofen (ADVIL) 800 MG tablet Take 800 mg by mouth 3 (three) times daily as needed.   Taking As Needed   lisinopril -hydrochlorothiazide  (ZESTORETIC ) 20-12.5 MG tablet TAKE 2 TABLETS BY MOUTH DAILY 180 tablet 0 12/26/2023   metoprolol  (TOPROL  XL) 200 MG 24 hr tablet Take 1 tablet (200 mg total) by mouth daily. 90 tablet 3 12/26/2023   montelukast  (SINGULAIR ) 10 MG tablet TAKE 1 TABLET BY MOUTH EVERY MORNING 90 tablet 1 12/26/2023   nitroGLYCERIN  (NITROSTAT ) 0.4 MG SL tablet Place 1 tablet (0.4 mg total) under the tongue every 5 (five) minutes as needed for chest pain. 100 tablet 3 Taking As  Needed   oxyCODONE -acetaminophen  (PERCOCET) 10-325 MG tablet Take 1 tablet by mouth every 4 (four) hours as needed for pain.   Taking As Needed   pantoprazole  (PROTONIX ) 40 MG tablet Take 1 tablet (40 mg total) by mouth daily. 30 tablet 11 12/26/2023   rosuvastatin  (CRESTOR ) 10 MG tablet Take 1 tablet (10 mg total) by mouth daily. 90 tablet 1 12/26/2023    Current medications: Current Facility-Administered Medications  Medication Dose Route Frequency Provider Last Rate Last Admin   0.9 %  sodium chloride  infusion   Intravenous Continuous Ponnala, Shruthi, MD 100 mL/hr at 12/28/23 1036 New Bag at 12/28/23 1036   acetaminophen  (TYLENOL ) tablet 650 mg  650 mg Oral Q6H PRN Mansy, Jan A, MD       Or   acetaminophen  (TYLENOL ) suppository 650 mg  650 mg Rectal Q6H PRN Mansy,  Jan A, MD       albuterol  (PROVENTIL ) (2.5 MG/3ML) 0.083% nebulizer solution 2.5 mg  2.5 mg Inhalation Q4H PRN Mansy, Jan A, MD       clopidogrel  (PLAVIX ) tablet 75 mg  75 mg Oral Daily Mansy, Jan A, MD   75 mg at 12/28/23 9072   cyanocobalamin  (VITAMIN B12) tablet 1,000 mcg  1,000 mcg Oral Q1500 Mansy, Jan A, MD   1,000 mcg at 12/28/23 9073   famotidine  (PEPCID ) tablet 20 mg  20 mg Oral BID Mansy, Jan A, MD   20 mg at 12/28/23 9072   heparin  injection 5,000 Units  5,000 Units Subcutaneous Q8H Mansy, Jan A, MD   5,000 Units at 12/28/23 1322   [START ON 12/29/2023] influenza vac split trivalent PF (FLUZONE ) injection 0.5 mL  0.5 mL Intramuscular Tomorrow-1000 Ponnala, Shruthi, MD       magnesium  hydroxide (MILK OF MAGNESIA) suspension 30 mL  30 mL Oral Daily PRN Mansy, Jan A, MD       montelukast  (SINGULAIR ) tablet 10 mg  10 mg Oral q morning Mansy, Jan A, MD   10 mg at 12/28/23 9072   nitroGLYCERIN  (NITROSTAT ) SL tablet 0.4 mg  0.4 mg Sublingual Q5 min PRN Mansy, Jan A, MD       ondansetron  (ZOFRAN ) tablet 4 mg  4 mg Oral Q6H PRN Mansy, Jan A, MD       Or   ondansetron  (ZOFRAN ) injection 4 mg  4 mg Intravenous Q6H PRN Mansy, Jan A, MD       oxyCODONE -acetaminophen  (PERCOCET/ROXICET) 5-325 MG per tablet 1 tablet  1 tablet Oral Q4H PRN Mansy, Jan A, MD   1 tablet at 12/28/23 1103   And   oxyCODONE  (Oxy IR/ROXICODONE ) immediate release tablet 5 mg  5 mg Oral Q4H PRN Mansy, Jan A, MD       pantoprazole  (PROTONIX ) EC tablet 40 mg  40 mg Oral Daily Mansy, Jan A, MD   40 mg at 12/28/23 9072   potassium chloride  SA (KLOR-CON  M) CR tablet 40 mEq  40 mEq Oral Q4H Ponnala, Shruthi, MD   40 mEq at 12/28/23 1038   traZODone  (DESYREL ) tablet 25 mg  25 mg Oral QHS PRN Mansy, Madison LABOR, MD          Allergies: No Known Allergies    Past Medical History: Past Medical History:  Diagnosis Date   Asthma    Coronary artery disease    Dysrhythmia    GERD (gastroesophageal reflux disease)    Hyperlipidemia     Hypertension      Past Surgical History: Past Surgical History:  Procedure Laterality Date   COLONOSCOPY N/A 09/09/2023   Procedure: COLONOSCOPY;  Surgeon: Onita Elspeth Sharper, DO;  Location: [facility] ENDOSCOPY;  Service: Gastroenterology;  Laterality: N/A;  Patient is on Plavix    ESOPHAGOGASTRODUODENOSCOPY N/A 09/09/2023   Procedure: EGD (ESOPHAGOGASTRODUODENOSCOPY);  Surgeon: Onita Elspeth Sharper, DO;  Location: [facility] ENDOSCOPY;  Service: Gastroenterology;  Laterality: N/A;   shoulder spur removed Left      Family History: History reviewed. No pertinent family history.   Social History: Social History   Socioeconomic History   Marital status: Married    Spouse name: Not on file   Number of children: Not on file   Years of education: Not on file   Highest education level: Not on file  Occupational History   Not on file  Tobacco Use   Smoking status: Every Day    Current packs/day: 1.00    Average packs/day: 1 pack/day for 26.0 years (26.0 ttl pk-yrs)    Types: Cigarettes   Smokeless tobacco: Never  Vaping Use   Vaping status: Never Used  Substance and Sexual Activity   Alcohol use: Not on file    Comment: 3 in the afternoons   Drug use: Not Currently   Sexual activity: Not on file  Other Topics Concern   Not on file  Social History Narrative   Not on file   Social Drivers of Health   Financial Resource Strain: Medium Risk (08/11/2023)   [facility]   Overall Financial Resource Strain (CARDIA)    Difficulty of Paying Living Expenses: Somewhat hard  Food Insecurity: No Food Insecurity (12/28/2023)   Hunger Vital Sign    Worried About Running Out of Food in the Last Year: Never true    Ran Out of Food in the Last Year: Never true  Transportation Needs: No Transportation Needs (12/28/2023)   PRAPARE - Administrator, Civil Service (Medical): No    Lack of Transportation (Non-Medical): No  Physical Activity: Not on file   Stress: Not on file  Social Connections: Not on file  Intimate Partner Violence: Not At Risk (12/28/2023)   Humiliation, Afraid, Rape, and Kick questionnaire    Fear of Current or Ex-Partner: No    Emotionally Abused: No    Physically Abused: No    Sexually Abused: No     Review of Systems: Review of Systems  Constitutional:  Negative for chills, fever and malaise/fatigue.  HENT:  Negative for congestion, sore throat and tinnitus.   Eyes:  Negative for blurred vision and redness.  Respiratory:  Negative for cough, shortness of breath and wheezing.   Cardiovascular:  Negative for chest pain, palpitations, claudication and leg swelling.  Gastrointestinal:  Positive for abdominal pain, diarrhea, nausea and vomiting. Negative for blood in stool.  Genitourinary:  Negative for flank pain, frequency and hematuria.  Musculoskeletal:  Negative for back pain, falls and myalgias.  Skin:  Negative for rash.  Neurological:  Negative for dizziness, weakness and headaches.  Endo/Heme/Allergies:  Does not bruise/bleed easily.  Psychiatric/Behavioral:  Negative for depression. The patient is not nervous/anxious and does not have insomnia.     Vital Signs: Blood pressure (!) 84/51, pulse 87, temperature 98.3 F (36.8 C), resp. rate 20, height 5' 8 (1.727 m), weight 68.3 kg, SpO2 97%.  Weight trends: Filed Weights   12/27/23 1648 12/28/23 1300  Weight: 61.7 kg 68.3 kg    Physical Exam: General: NAD,   Head: Normocephalic, atraumatic. Moist oral mucosal membranes  Eyes: Anicteric  Neck: Supple  Lungs:  Clear to auscultation, normal effort  Heart: Regular rate and rhythm  Abdomen:  Soft, nontender  Extremities:  No peripheral edema.  Neurologic: Alert, oriented  Skin: No lesions        Lab results: Basic Metabolic Panel: Recent Labs  Lab 12/27/23 1649 12/28/23 0253  NA 130* 136  K 3.5 3.0*  CL 92* 107  CO2 11* 12*  GLUCOSE 122* 73  BUN 97* 83*  CREATININE 14.21* 11.88*   CALCIUM  7.6* 5.4*  MG  --  1.7  PHOS  --  8.0*    Liver Function Tests: Recent Labs  Lab 12/27/23 1649 12/28/23 0253  AST 14*  --   ALT 7  --   ALKPHOS 47  --   BILITOT 0.7  --   PROT 7.1  --   ALBUMIN 3.1* 2.2*   No results for input(s): LIPASE, AMYLASE in the last 168 hours. No results for input(s): AMMONIA in the last 168 hours.  CBC: Recent Labs  Lab 12/27/23 1649 12/28/23 0253  WBC 7.4 5.9  HGB 13.1 10.3*  HCT 35.9* 29.0*  MCV 87.1 88.7  PLT 257 221    Cardiac Enzymes: Recent Labs  Lab 12/27/23 1649  CKTOTAL 439*    BNP: Invalid input(s): POCBNP  CBG: No results for input(s): GLUCAP in the last 168 hours.  Microbiology: Results for orders placed or performed during the hospital encounter of 12/27/23  Resp panel by RT-PCR (RSV, Flu A&B, Covid) Anterior Nasal Swab     Status: None   Collection Time: 12/27/23  5:27 PM   Specimen: Anterior Nasal Swab  Result Value Ref Range Status   SARS Coronavirus 2 by RT PCR NEGATIVE NEGATIVE Final    Comment: (NOTE) SARS-CoV-2 target nucleic acids are NOT DETECTED.  The SARS-CoV-2 RNA is generally detectable in upper respiratory specimens during the acute phase of infection. The lowest concentration of SARS-CoV-2 viral copies this assay can detect is 138 copies/mL. A negative result does not preclude SARS-Cov-2 infection and should not be used as the sole basis for treatment or other patient management decisions. A negative result may occur with  improper specimen collection/handling, submission of specimen other than nasopharyngeal swab, presence of viral mutation(s) within the areas targeted by this assay, and inadequate number of viral copies(<138 copies/mL). A negative result must be combined with clinical observations, patient history, and epidemiological information. The expected result is Negative.  Fact Sheet for Patients:  BloggerCourse.com  Fact Sheet for  Healthcare Providers:  SeriousBroker.it  This test is no t yet approved or cleared by the [place]  FDA and  has been authorized for detection and/or diagnosis of SARS-CoV-2 by FDA under an Emergency Use Authorization (EUA). This EUA will remain  in effect (meaning this test can be used) for the duration of the COVID-19 declaration under Section 564(b)(1) of the Act, 21 U.S.C.section 360bbb-3(b)(1), unless the authorization is terminated  or revoked sooner.       Influenza A by PCR NEGATIVE NEGATIVE Final   Influenza B by PCR NEGATIVE NEGATIVE Final    Comment: (NOTE) The Xpert Xpress SARS-CoV-2/FLU/RSV plus assay is intended as an aid in the diagnosis of influenza from Nasopharyngeal swab specimens and should not be used as a sole basis for treatment. Nasal washings and aspirates are unacceptable for Xpert Xpress SARS-CoV-2/FLU/RSV testing.  Fact Sheet for Patients: BloggerCourse.com  Fact Sheet for Healthcare Providers: SeriousBroker.it  This test is not yet approved or cleared  by the [place]  FDA and has been authorized for detection and/or diagnosis of SARS-CoV-2 by FDA under an Emergency Use Authorization (EUA). This EUA will remain in effect (meaning this test can be used) for the duration of the COVID-19 declaration under Section 564(b)(1) of the Act, 21 U.S.C. section 360bbb-3(b)(1), unless the authorization is terminated or revoked.     Resp Syncytial Virus by PCR NEGATIVE NEGATIVE Final    Comment: (NOTE) Fact Sheet for Patients: BloggerCourse.com  Fact Sheet for Healthcare Providers: SeriousBroker.it  This test is not yet approved or cleared by the [place]  FDA and has been authorized for detection and/or diagnosis of SARS-CoV-2 by FDA under an Emergency Use Authorization (EUA). This EUA will remain in effect (meaning this  test can be used) for the duration of the COVID-19 declaration under Section 564(b)(1) of the Act, 21 U.S.C. section 360bbb-3(b)(1), unless the authorization is terminated or revoked.  Performed at [facility], [address] [place], [place] [address]     Coagulation Studies: No results for input(s): LABPROT, INR in the last 72 hours.  Urinalysis: Recent Labs    12/28/23 0836  COLORURINE YELLOW*  LABSPEC 1.013  PHURINE 5.0  GLUCOSEU NEGATIVE  HGBUR SMALL*  BILIRUBINUR NEGATIVE  KETONESUR NEGATIVE  PROTEINUR 100*  NITRITE NEGATIVE  LEUKOCYTESUR NEGATIVE      Imaging: CT Renal Stone Study Result Date: 12/27/2023 EXAM: CT ABDOMEN AND PELVIS WITHOUT CONTRAST 12/27/2023 08:03:00 PM TECHNIQUE: CT of the abdomen and pelvis was performed without the administration of intravenous contrast. Multiplanar reformatted images are provided for review. Automated exposure control, iterative reconstruction, and/or weight-based adjustment of the mA/kV was utilized to reduce the radiation dose to as low as reasonably achievable. COMPARISON: None available. CLINICAL HISTORY: Abdominal/flank pain, stone suspected. N/V/D for the last five days. Pt sts that he has not urinated in the last three days. Pt sts before he stopped urinating his urine was dark like tea. Pt abd is distended. Per EMS pt was hypotensive on their arrival. EMS gave 1L of LR enroute to ED via; 20g in the RFA with 4mg  of zofran . FINDINGS: LOWER CHEST: Tiny hiatal hernia. LIVER: Mild hepatic steatosis. GALLBLADDER AND BILE DUCTS: Gallbladder is unremarkable. No biliary ductal dilatation. SPLEEN: No acute abnormality. PANCREAS: No acute abnormality. ADRENAL GLANDS: No acute abnormality. KIDNEYS, URETERS AND BLADDER: 11 mm simple right lower pole renal cyst (image 37), benign (Bosniak 1). No follow up is recommended. No stones in the kidneys or ureters. No hydronephrosis. No perinephric or periureteral stranding. Urinary  bladder is unremarkable. GI AND BOWEL: Normal appendix (image 55). There is no bowel obstruction. PERITONEUM AND RETROPERITONEUM: No ascites. No free air. VASCULATURE: Atherosclerotic calcifications of the abdominal aorta and branch vessels. LYMPH NODES: No lymphadenopathy. REPRODUCTIVE ORGANS: Prostate is unremarkable. BONES AND SOFT TISSUES: Thoracic spine stimulator. Tiny fat-containing left inguinal hernia. No acute osseous abnormality. IMPRESSION: 1. No acute findings in the abdomen or pelvis. 2. Mild hepatic steatosis. Electronically signed by: Pinkie Pebbles MD 12/27/2023 08:22 PM EDT RP Workstation: [id number]   DG Chest 2 View Result Date: 12/27/2023 EXAM: 2 VIEW(S) XRAY OF THE CHEST 12/27/2023 07:42:00 PM COMPARISON: 11/23/2007 CLINICAL HISTORY: Cough. The reason for exam is stated as cough. Patient also with N/V/D x 5 days, abdominal pain, and inability to urinate x 3 days. Hx of CAD, HTN, and current smoker of 1 pack a day. FINDINGS: LUNGS AND PLEURA: No focal pulmonary opacity. No pulmonary edema. No pleural effusion. No pneumothorax. HEART AND MEDIASTINUM: No  acute abnormality of the cardiac and mediastinal silhouettes. BONES AND SOFT TISSUES: Osteolysis of the left distal clavicle. No acute osseous abnormality. IMPRESSION: 1. No acute cardiopulmonary process. Electronically signed by: Pinkie Pebbles MD 12/27/2023 08:16 PM EDT RP Workstation: [id number]     Assessment & Plan: Mr. ARLESTER KEEHAN is a 53 y.o.  male with past medical conditions including CAD, hypertension, GERD, and dyslipidemia, who was admitted to [facility] on 12/27/2023 for AKI (acute kidney injury) [N17.9] Acute renal failure, unspecified acute renal failure type [N17.9]  Acute kidney injury likely secondary to severe dehydration.  Normal renal function noted in May of this year.  CT abdomen pelvis negative for renal obstruction.  No recent IV contrast exposure.  No acute indication for dialysis as patient is now producing  urine.  Agree with IV hydration and patient encouraged him to maintain oral intake.  Discussed with patient that if renal improvement stalls, we may have to consider temporary renal replacement therapy.  Will continue to monitor for now.  2.  Acute metabolic acidosis, serum bicarb 11 on ED arrival.  Will order oral sodium bicarbonate  supplementation at 1300 mg twice daily.  3.  Hypocalcemia, S calcium  7.6 on ED arrival.  5.4 this morning.  Corrected calcium  only 6.8.  Can consider IV supplementation.     LOS: 1 Theadora Noyes 9/23/20252:32 PM

## 2023-12-28 NOTE — H&P (Signed)
 [place]   PATIENT NAME: Jesse Nelson    MR#:  [id number]  DATE OF BIRTH:  10/02/1970  DATE OF ADMISSION:  12/27/2023  PRIMARY CARE PHYSICIAN: [name], MD   Patient is coming from: Home  REQUESTING/REFERRING PHYSICIAN: Dorothyann Drivers, MD  CHIEF COMPLAINT:   Chief Complaint  Patient presents with   Abdominal Pain    HISTORY OF PRESENT ILLNESS:  Jesse Nelson is a 53 y.o. Caucasian male with medical history significant for asthma, coronary artery disease, GERD, hypertension and dyslipidemia, who presented to the emergency room with acute onset of recurrent nausea and vomiting with associated diarrhea which have been going on since Wednesday.  He has not had much urine output over the last 4 days.  He quit drinking alcohol about a week ago due to nausea.  He denies any chest pain or palpitations.  No cough or wheezing or dyspnea.  No dysuria or hematuria or flank pain. He is to drink 6-8 beers per day.  ED Course: Upon arrival to the emergency room, vital signs were within normal and later BP was 98/53.  Labs revealed mild hyponatremia 130 and hypochloremia of 92 with CO2 of 11 and glucose of 122 BUN of 97 creatinine 14.21 previously normal and calcium  7.6 with anion gap of 27, albumin 3.1, total CK of 439 and high sensitive troponin I of 4.  CBC showed a hematocrit of 35.9 and hemoglobin of 13.  Respiratory panel came back negative.  EKG as reviewed by me : EKG showed normal sinus rhythm with a rate of 74. Imaging: Renal stone CT came back with mild hepatic steatosis with no acute findings in the abdomen or pelvis.  2 view chest x-ray showed no acute cardiopulmonary disease.  The patient was given 3 L bolus of IV normal saline followed by 150 mL/h.  He will be admitted to a progressive unit bed for further evaluation and management. PAST MEDICAL HISTORY:   Past Medical History:  Diagnosis Date   Asthma    Coronary artery disease    Dysrhythmia    GERD  (gastroesophageal reflux disease)    Hyperlipidemia    Hypertension     PAST SURGICAL HISTORY:   Past Surgical History:  Procedure Laterality Date   COLONOSCOPY N/A 09/09/2023   Procedure: COLONOSCOPY;  Surgeon: Onita Elspeth Sharper, DO;  Location: [facility] ENDOSCOPY;  Service: Gastroenterology;  Laterality: N/A;  Patient is on Plavix    ESOPHAGOGASTRODUODENOSCOPY N/A 09/09/2023   Procedure: EGD (ESOPHAGOGASTRODUODENOSCOPY);  Surgeon: Onita Elspeth Sharper, DO;  Location: [facility] ENDOSCOPY;  Service: Gastroenterology;  Laterality: N/A;   shoulder spur removed Left     SOCIAL HISTORY:   Social History   Tobacco Use   Smoking status: Every Day    Current packs/day: 1.00    Average packs/day: 1 pack/day for 26.0 years (26.0 ttl pk-yrs)    Types: Cigarettes   Smokeless tobacco: Never  Substance Use Topics   Alcohol use: Not on file    Comment: 3 in the afternoons    FAMILY HISTORY:   Positive for diabetes mellitus, hypertension, CVA, cancer and MI.  DRUG ALLERGIES:  No Known Allergies  REVIEW OF SYSTEMS:   ROS As per history of present illness. All pertinent systems were reviewed above. Constitutional, HEENT, cardiovascular, respiratory, GI, GU, musculoskeletal, neuro, psychiatric, endocrine, integumentary and hematologic systems were reviewed and are otherwise negative/unremarkable except for positive findings mentioned above in the HPI.   MEDICATIONS AT HOME:   Prior  to Admission medications   Medication Sig Start Date End Date Taking? Authorizing Provider  albuterol  (VENTOLIN  HFA) 108 (90 Base) MCG/ACT inhaler INHALE 1 PUFF EVERY 6 HOURS AS NEEDED 11/22/23  Yes Tejan-Sie, GORMAN Dine, MD  amLODipine  (NORVASC ) 5 MG tablet TAKE 1 TABLET(5 MG) BY MOUTH EVERY MORNING 06/02/23  Yes Scoggins, Amber, NP  aspirin  81 MG chewable tablet Chew 324 mg by mouth once.   Yes [provider]  clopidogrel  (PLAVIX ) 75 MG tablet Take 75 mg by mouth daily.   Yes [provider]   Cyanocobalamin  (VITAMIN B 12) 500 MCG TABS Take 1,000 mcg by mouth daily in the afternoon. 11/22/23 02/20/24 Yes [name], MD  famotidine  (PEPCID ) 20 MG tablet Take 1 tablet (20 mg total) by mouth 2 (two) times daily. 07/20/23  Yes Fernand Denyse LABOR, MD  fluticasone (FLONASE) 50 MCG/ACT nasal spray Place 2 sprays into both nostrils daily. Patient taking differently: Place 2 sprays into both nostrils as needed.   Yes [provider]  ibuprofen (ADVIL) 800 MG tablet Take 800 mg by mouth 3 (three) times daily as needed.   Yes [provider]  lisinopril -hydrochlorothiazide  (ZESTORETIC ) 20-12.5 MG tablet TAKE 2 TABLETS BY MOUTH DAILY 10/01/23  Yes Fernand Denyse LABOR, MD  metoprolol  (TOPROL  XL) 200 MG 24 hr tablet Take 1 tablet (200 mg total) by mouth daily. 05/17/23 05/16/24 Yes Fernand Denyse LABOR, MD  montelukast  (SINGULAIR ) 10 MG tablet TAKE 1 TABLET BY MOUTH EVERY MORNING 10/01/23  Yes [name], MD  nitroGLYCERIN  (NITROSTAT ) 0.4 MG SL tablet Place 1 tablet (0.4 mg total) under the tongue every 5 (five) minutes as needed for chest pain. 07/23/22 12/27/23 Yes Fernand Denyse LABOR, MD  oxyCODONE -acetaminophen  (PERCOCET) 10-325 MG tablet Take 1 tablet by mouth every 4 (four) hours as needed for pain.   Yes [provider]  pantoprazole  (PROTONIX ) 40 MG tablet Take 1 tablet (40 mg total) by mouth daily. 11/26/23 11/25/24 Yes Fernand Denyse LABOR, MD  rosuvastatin  (CRESTOR ) 10 MG tablet Take 1 tablet (10 mg total) by mouth daily. 08/18/23 02/14/24 Yes Tejan-Sie, GORMAN Dine, MD      VITAL SIGNS:  Blood pressure (!) 98/55, pulse 98, temperature 98.4 F (36.9 C), temperature source Oral, resp. rate 17, height 5' 8 (1.727 m), weight 61.7 kg, SpO2 97%.  PHYSICAL EXAMINATION:  Physical Exam  GENERAL:  53 y.o.-year-old Caucasian male patient lying in the bed with no acute distress.  EYES: Pupils equal, round, reactive to light and accommodation. No scleral icterus. Extraocular muscles  intact.  HEENT: Head atraumatic, normocephalic. Oropharynx and nasopharynx clear.  NECK:  Supple, no jugular venous distention. No thyroid enlargement, no tenderness.  LUNGS: Normal breath sounds bilaterally, no wheezing, rales,rhonchi or crepitation. No use of accessory muscles of respiration.  CARDIOVASCULAR: Regular rate and rhythm, S1, S2 normal. No murmurs, rubs, or gallops.  ABDOMEN: Soft, nondistended, nontender. Bowel sounds present. No organomegaly or mass.  EXTREMITIES: No pedal edema, cyanosis, or clubbing.  NEUROLOGIC: Cranial nerves II through XII are intact. Muscle strength 5/5 in all extremities. Sensation intact. Gait not checked.  PSYCHIATRIC: The patient is alert and oriented x 3.  Normal affect and good eye contact. SKIN: No obvious rash, lesion, or ulcer.   LABORATORY PANEL:   CBC Recent Labs  Lab 12/28/23 0253  WBC 5.9  HGB 10.3*  HCT 29.0*  PLT 221   ------------------------------------------------------------------------------------------------------------------  Chemistries  Recent Labs  Lab 12/27/23 1649 12/28/23 0253  NA 130* 136  K  3.5 3.0*  CL 92* 107  CO2 11* 12*  GLUCOSE 122* 73  BUN 97* 83*  CREATININE 14.21* 11.88*  CALCIUM  7.6* 5.4*  AST 14*  --   ALT 7  --   ALKPHOS 47  --   BILITOT 0.7  --    ------------------------------------------------------------------------------------------------------------------  Cardiac Enzymes No results for input(s): TROPONINI in the last 168 hours. ------------------------------------------------------------------------------------------------------------------  RADIOLOGY:  CT Renal Stone Study Result Date: 12/27/2023 EXAM: CT ABDOMEN AND PELVIS WITHOUT CONTRAST 12/27/2023 08:03:00 PM TECHNIQUE: CT of the abdomen and pelvis was performed without the administration of intravenous contrast. Multiplanar reformatted images are provided for review. Automated exposure control, iterative reconstruction,  and/or weight-based adjustment of the mA/kV was utilized to reduce the radiation dose to as low as reasonably achievable. COMPARISON: None available. CLINICAL HISTORY: Abdominal/flank pain, stone suspected. N/V/D for the last five days. Pt sts that he has not urinated in the last three days. Pt sts before he stopped urinating his urine was dark like tea. Pt abd is distended. Per EMS pt was hypotensive on their arrival. EMS gave 1L of LR enroute to ED via; 20g in the RFA with 4mg  of zofran . FINDINGS: LOWER CHEST: Tiny hiatal hernia. LIVER: Mild hepatic steatosis. GALLBLADDER AND BILE DUCTS: Gallbladder is unremarkable. No biliary ductal dilatation. SPLEEN: No acute abnormality. PANCREAS: No acute abnormality. ADRENAL GLANDS: No acute abnormality. KIDNEYS, URETERS AND BLADDER: 11 mm simple right lower pole renal cyst (image 37), benign (Bosniak 1). No follow up is recommended. No stones in the kidneys or ureters. No hydronephrosis. No perinephric or periureteral stranding. Urinary bladder is unremarkable. GI AND BOWEL: Normal appendix (image 55). There is no bowel obstruction. PERITONEUM AND RETROPERITONEUM: No ascites. No free air. VASCULATURE: Atherosclerotic calcifications of the abdominal aorta and branch vessels. LYMPH NODES: No lymphadenopathy. REPRODUCTIVE ORGANS: Prostate is unremarkable. BONES AND SOFT TISSUES: Thoracic spine stimulator. Tiny fat-containing left inguinal hernia. No acute osseous abnormality. IMPRESSION: 1. No acute findings in the abdomen or pelvis. 2. Mild hepatic steatosis. Electronically signed by: Pinkie Pebbles MD 12/27/2023 08:22 PM EDT RP Workstation: [id number]   DG Chest 2 View Result Date: 12/27/2023 EXAM: 2 VIEW(S) XRAY OF THE CHEST 12/27/2023 07:42:00 PM COMPARISON: 11/23/2007 CLINICAL HISTORY: Cough. The reason for exam is stated as cough. Patient also with N/V/D x 5 days, abdominal pain, and inability to urinate x 3 days. Hx of CAD, HTN, and current smoker of 1 pack a day.  FINDINGS: LUNGS AND PLEURA: No focal pulmonary opacity. No pulmonary edema. No pleural effusion. No pneumothorax. HEART AND MEDIASTINUM: No acute abnormality of the cardiac and mediastinal silhouettes. BONES AND SOFT TISSUES: Osteolysis of the left distal clavicle. No acute osseous abnormality. IMPRESSION: 1. No acute cardiopulmonary process. Electronically signed by: Pinkie Pebbles MD 12/27/2023 08:16 PM EDT RP Workstation: [id number]      IMPRESSION AND PLAN:  Assessment and Plan: * AKI (acute kidney injury) - This is likely prerenal secondary to severe dehydration. - The patient will be admitted to a progressive unit bed. - Will continue hydration with IV normal saline. - Will follow BMPs. - Will avoid nephrotoxins.  Asthma, chronic - Will continue Singulair  and as needed albuterol .  Dyslipidemia - Will hold off statin therapy given elevated CK and associated rhabdomyolysis.  Essential hypertension - Will continue antihypertensive therapy.  GERD without esophagitis - Will continue PPI therapy and H2 blockers.    DVT prophylaxis: Lovenox.. Advanced Care Planning:  Code Status: full code. Family Communication:  The plan of care was  discussed in details with the patient (and family). I answered all questions. The patient agreed to proceed with the above mentioned plan. Further management will depend upon hospital course. Disposition Plan: Back to previous home environment Consults called: Nephrology All the records are reviewed and case discussed with ED provider.  Status is: Inpatient  At the time of the admission, it appears that the appropriate admission status for this patient is inpatient.  This is judged to be reasonable and necessary in order to provide the required intensity of service to ensure the patient's safety given the presenting symptoms, physical exam findings and initial radiographic and laboratory data in the context of comorbid conditions.  The patient  requires inpatient status due to high intensity of service, high risk of further deterioration and high frequency of surveillance required.  I certify that at the time of admission, it is my clinical judgment that the patient will require inpatient hospital care extending more than 2 midnights.                            Dispo: The patient is from: Home              Anticipated d/c is to: Home              Patient currently is not medically stable to d/c.              Difficult to place patient: No  Madison DELENA Peaches M.D on 12/28/2023 at 5:18 AM  [facility]   From 7 PM-7 AM, contact night-coverage [email or web address]  CC: Primary care physician; [name], MD

## 2023-12-28 NOTE — Progress Notes (Signed)
 PROGRESS NOTE    Jesse MELLS  FMW:[id number] DOB: 1971/01/31 DOA: 12/27/2023 PCP: [name], MD  Chief Complaint  Patient presents with   Abdominal Pain    Hospital Course:  REFOEL Nelson is a 53 y.o. Caucasian male with medical history significant for asthma, coronary artery disease, GERD, hypertension and dyslipidemia, who presented to the emergency room with acute onset of recurrent nausea and vomiting with associated diarrhea which have been going on since Wednesday.  Quit drinking alcohol about 6 days ago prior to presentation due to nausea Admitted for AKI, Nephrology following   Subjective: Was examined at the bedside, new to me today.  States nausea has improved significantly.  Denies further diarrhea.  No new complaints   Objective: Vitals:   12/28/23 0630 12/28/23 0700 12/28/23 0815 12/28/23 0831  BP: (!) 95/55 (!) 91/56 (!) 89/59   Pulse: 93 93 94   Resp: (!) 22 20 20    Temp:    98.4 F (36.9 C)  TempSrc:    Oral  SpO2: 96% 97% 100%   Weight:      Height:        Intake/Output Summary (Last 24 hours) at 12/28/2023 0959 Last data filed at 12/27/2023 2109 Gross per 24 hour  Intake 1000 ml  Output --  Net 1000 ml   Filed Weights   12/27/23 1648  Weight: 61.7 kg    Examination: GENERAL:  comfortable, no acute distress NECK:  Supple, no jugular venous distention. No thyroid enlargement, no tenderness.  LUNGS: Normal breath sounds bilaterally, no wheezing  CARDIOVASCULAR: Regular rate and rhythm, S1, S2 normal. No murmurs, rubs, or gallops.  ABDOMEN: Soft, nondistended, nontender. Bowel sounds present EXTREMITIES: No pedal edema, cyanosis, or clubbing NEUROLOGIC: AO x 3, no gross focal deficits  Assessment & Plan:  Principal Problem:   AKI (acute kidney injury) Active Problems:   GERD without esophagitis   Essential hypertension   Dyslipidemia   Asthma, chronic  AKI (acute kidney injury) HAGMA - This is likely prerenal secondary to  severe dehydration - Cr 14.21 -> 11.88 - CT Renal no acute findings - Continue IV fluids, encourage po intake - po bicarbs tabs - Nephrology following, appreciate recs. No indication for urgent dialysis - Monitor Cr  Hypokalemia Magnesium  normal Replete with p.o. potassium  Hypocalcemia - Corrected calcium  6.8 - IV Calcium  2gm  Normocytic anemia - Drop in Hb to s/p IV fluidS - Monitor Hb  Alcohol use - Drinks 6-8 beer daily, last drink 09/17 - Denies alcohol withdrawal in the past - Monitor, FA, MV, B1   Asthma, not in exacerbation - Singulair  and as needed albuterol .   Dyslipidemia - hold off statin therapy given elevated CK   Essential hypertension Hypotension - Hold amlodipine , Lisinopril , hydrochlorothiazide , Metoprolol  due to borderline low BP   GERD without esophagitis - Will continue PPI therapy and H2 blockers  DVT prophylaxis: Heparin  SQ   Code Status: Full Code Disposition:  Home  Consultants:  Nephrology  Procedures:  None  Antimicrobials:  Anti-infectives (From admission, onward)    None       Data Reviewed: I have personally reviewed following labs and imaging studies CBC: Recent Labs  Lab 12/27/23 1649 12/28/23 0253  WBC 7.4 5.9  HGB 13.1 10.3*  HCT 35.9* 29.0*  MCV 87.1 88.7  PLT 257 221   Basic Metabolic Panel: Recent Labs  Lab 12/27/23 1649 12/28/23 0253  NA 130* 136  K 3.5 3.0*  CL 92* 107  CO2 11* 12*  GLUCOSE 122* 73  BUN 97* 83*  CREATININE 14.21* 11.88*  CALCIUM  7.6* 5.4*   GFR: Estimated Creatinine Clearance: 6.3 mL/min (A) (by C-G formula based on SCr of 11.88 mg/dL (H)). Liver Function Tests: Recent Labs  Lab 12/27/23 1649  AST 14*  ALT 7  ALKPHOS 47  BILITOT 0.7  PROT 7.1  ALBUMIN 3.1*   CBG: No results for input(s): GLUCAP in the last 168 hours.  Recent Results (from the past 240 hours)  Resp panel by RT-PCR (RSV, Flu A&B, Covid) Anterior Nasal Swab     Status: None   Collection Time:  12/27/23  5:27 PM   Specimen: Anterior Nasal Swab  Result Value Ref Range Status   SARS Coronavirus 2 by RT PCR NEGATIVE NEGATIVE Final    Comment: (NOTE) SARS-CoV-2 target nucleic acids are NOT DETECTED.  The SARS-CoV-2 RNA is generally detectable in upper respiratory specimens during the acute phase of infection. The lowest concentration of SARS-CoV-2 viral copies this assay can detect is 138 copies/mL. A negative result does not preclude SARS-Cov-2 infection and should not be used as the sole basis for treatment or other patient management decisions. A negative result may occur with  improper specimen collection/handling, submission of specimen other than nasopharyngeal swab, presence of viral mutation(s) within the areas targeted by this assay, and inadequate number of viral copies(<138 copies/mL). A negative result must be combined with clinical observations, patient history, and epidemiological information. The expected result is Negative.  Fact Sheet for Patients:  BloggerCourse.com  Fact Sheet for Healthcare Providers:  SeriousBroker.it  This test is no t yet approved or cleared by the [place]  FDA and  has been authorized for detection and/or diagnosis of SARS-CoV-2 by FDA under an Emergency Use Authorization (EUA). This EUA will remain  in effect (meaning this test can be used) for the duration of the COVID-19 declaration under Section 564(b)(1) of the Act, 21 U.S.C.section 360bbb-3(b)(1), unless the authorization is terminated  or revoked sooner.       Influenza A by PCR NEGATIVE NEGATIVE Final   Influenza B by PCR NEGATIVE NEGATIVE Final    Comment: (NOTE) The Xpert Xpress SARS-CoV-2/FLU/RSV plus assay is intended as an aid in the diagnosis of influenza from Nasopharyngeal swab specimens and should not be used as a sole basis for treatment. Nasal washings and aspirates are unacceptable for Xpert Xpress  SARS-CoV-2/FLU/RSV testing.  Fact Sheet for Patients: BloggerCourse.com  Fact Sheet for Healthcare Providers: SeriousBroker.it  This test is not yet approved or cleared by the [place]  FDA and has been authorized for detection and/or diagnosis of SARS-CoV-2 by FDA under an Emergency Use Authorization (EUA). This EUA will remain in effect (meaning this test can be used) for the duration of the COVID-19 declaration under Section 564(b)(1) of the Act, 21 U.S.C. section 360bbb-3(b)(1), unless the authorization is terminated or revoked.     Resp Syncytial Virus by PCR NEGATIVE NEGATIVE Final    Comment: (NOTE) Fact Sheet for Patients: BloggerCourse.com  Fact Sheet for Healthcare Providers: SeriousBroker.it  This test is not yet approved or cleared by the [place]  FDA and has been authorized for detection and/or diagnosis of SARS-CoV-2 by FDA under an Emergency Use Authorization (EUA). This EUA will remain in effect (meaning this test can be used) for the duration of the COVID-19 declaration under Section 564(b)(1) of the Act, 21 U.S.C. section 360bbb-3(b)(1), unless the authorization is terminated or revoked.  Performed at [facility], [address] [place], [place] [address]      Radiology Studies: CT Renal Stone Study Result Date: 12/27/2023 EXAM: CT ABDOMEN AND PELVIS WITHOUT CONTRAST 12/27/2023 08:03:00 PM TECHNIQUE: CT of the abdomen and pelvis was performed without the administration of intravenous contrast. Multiplanar reformatted images are provided for review. Automated exposure control, iterative reconstruction, and/or weight-based adjustment of the mA/kV was utilized to reduce the radiation dose to as low as reasonably achievable. COMPARISON: None available. CLINICAL HISTORY: Abdominal/flank pain, stone suspected. N/V/D for the last five days. Pt sts  that he has not urinated in the last three days. Pt sts before he stopped urinating his urine was dark like tea. Pt abd is distended. Per EMS pt was hypotensive on their arrival. EMS gave 1L of LR enroute to ED via; 20g in the RFA with 4mg  of zofran . FINDINGS: LOWER CHEST: Tiny hiatal hernia. LIVER: Mild hepatic steatosis. GALLBLADDER AND BILE DUCTS: Gallbladder is unremarkable. No biliary ductal dilatation. SPLEEN: No acute abnormality. PANCREAS: No acute abnormality. ADRENAL GLANDS: No acute abnormality. KIDNEYS, URETERS AND BLADDER: 11 mm simple right lower pole renal cyst (image 37), benign (Bosniak 1). No follow up is recommended. No stones in the kidneys or ureters. No hydronephrosis. No perinephric or periureteral stranding. Urinary bladder is unremarkable. GI AND BOWEL: Normal appendix (image 55). There is no bowel obstruction. PERITONEUM AND RETROPERITONEUM: No ascites. No free air. VASCULATURE: Atherosclerotic calcifications of the abdominal aorta and branch vessels. LYMPH NODES: No lymphadenopathy. REPRODUCTIVE ORGANS: Prostate is unremarkable. BONES AND SOFT TISSUES: Thoracic spine stimulator. Tiny fat-containing left inguinal hernia. No acute osseous abnormality. IMPRESSION: 1. No acute findings in the abdomen or pelvis. 2. Mild hepatic steatosis. Electronically signed by: Pinkie Pebbles MD 12/27/2023 08:22 PM EDT RP Workstation: [id number]   DG Chest 2 View Result Date: 12/27/2023 EXAM: 2 VIEW(S) XRAY OF THE CHEST 12/27/2023 07:42:00 PM COMPARISON: 11/23/2007 CLINICAL HISTORY: Cough. The reason for exam is stated as cough. Patient also with N/V/D x 5 days, abdominal pain, and inability to urinate x 3 days. Hx of CAD, HTN, and current smoker of 1 pack a day. FINDINGS: LUNGS AND PLEURA: No focal pulmonary opacity. No pulmonary edema. No pleural effusion. No pneumothorax. HEART AND MEDIASTINUM: No acute abnormality of the cardiac and mediastinal silhouettes. BONES AND SOFT TISSUES: Osteolysis of the  left distal clavicle. No acute osseous abnormality. IMPRESSION: 1. No acute cardiopulmonary process. Electronically signed by: Pinkie Pebbles MD 12/27/2023 08:16 PM EDT RP Workstation: [id number]    Scheduled Meds:  clopidogrel   75 mg Oral Daily   cyanocobalamin   1,000 mcg Oral Q1500   famotidine   20 mg Oral BID   heparin   5,000 Units Subcutaneous Q8H   metoprolol   200 mg Oral Daily   montelukast   10 mg Oral q morning   pantoprazole   40 mg Oral Daily   rosuvastatin   10 mg Oral Daily   Continuous Infusions:  sodium chloride  100 mL/hr at 12/28/23 0250     LOS: 1 day  MDM: Patient is high risk for one or more organ failure.  They necessitate ongoing hospitalization for continued IV therapies and subsequent lab monitoring. Total time spent interpreting labs and vitals, reviewing the medical record, coordinating care amongst consultants and care team members, directly assessing and discussing care with the patient and/or family: 55 min Alayasia Breeding,MD [facility]  To contact the attending physician between 7A-7P please use Epic Chat. To contact the covering physician during after hours 7P-7A, please review Amion.  12/28/2023, 9:59 AM   *  This document has been created with the assistance of dictation software. Please excuse typographical errors. *

## 2023-12-28 NOTE — Assessment & Plan Note (Addendum)
-   Will continue PPI therapy and H2 blockers.

## 2023-12-28 NOTE — Assessment & Plan Note (Signed)
-   Will hold off statin therapy given elevated CK and associated rhabdomyolysis.

## 2023-12-29 DIAGNOSIS — I10 Essential (primary) hypertension: Secondary | ICD-10-CM | POA: Diagnosis not present

## 2023-12-29 DIAGNOSIS — N179 Acute kidney failure, unspecified: Secondary | ICD-10-CM | POA: Diagnosis not present

## 2023-12-29 DIAGNOSIS — D649 Anemia, unspecified: Secondary | ICD-10-CM

## 2023-12-29 DIAGNOSIS — K219 Gastro-esophageal reflux disease without esophagitis: Secondary | ICD-10-CM | POA: Diagnosis not present

## 2023-12-29 LAB — CBC
HCT: 32.6 % — ABNORMAL LOW (ref 39.0–52.0)
Hemoglobin: 11.6 g/dL — ABNORMAL LOW (ref 13.0–17.0)
MCH: 31.2 pg (ref 26.0–34.0)
MCHC: 35.6 g/dL (ref 30.0–36.0)
MCV: 87.6 fL (ref 80.0–100.0)
Platelets: 289 K/uL (ref 150–400)
RBC: 3.72 MIL/uL — ABNORMAL LOW (ref 4.22–5.81)
RDW: 12.9 % (ref 11.5–15.5)
WBC: 6.6 K/uL (ref 4.0–10.5)
nRBC: 0 % (ref 0.0–0.2)

## 2023-12-29 LAB — BASIC METABOLIC PANEL WITH GFR
Anion gap: 16 — ABNORMAL HIGH (ref 5–15)
BUN: 91 mg/dL — ABNORMAL HIGH (ref 6–20)
CO2: 12 mmol/L — ABNORMAL LOW (ref 22–32)
Calcium: 7.3 mg/dL — ABNORMAL LOW (ref 8.9–10.3)
Chloride: 107 mmol/L (ref 98–111)
Creatinine, Ser: 9.56 mg/dL — ABNORMAL HIGH (ref 0.61–1.24)
GFR, Estimated: 6 mL/min — ABNORMAL LOW
Glucose, Bld: 95 mg/dL (ref 70–99)
Potassium: 3.4 mmol/L — ABNORMAL LOW (ref 3.5–5.1)
Sodium: 135 mmol/L (ref 135–145)

## 2023-12-29 LAB — C DIFFICILE QUICK SCREEN W PCR REFLEX
C Diff antigen: NEGATIVE
C Diff interpretation: NOT DETECTED
C Diff toxin: NEGATIVE

## 2023-12-29 MED ORDER — POTASSIUM CHLORIDE CRYS ER 20 MEQ PO TBCR
20.0000 meq | EXTENDED_RELEASE_TABLET | Freq: Once | ORAL | Status: AC
  Administered 2023-12-29: 20 meq via ORAL
  Filled 2023-12-29: qty 1

## 2023-12-29 MED ORDER — FAMOTIDINE 20 MG PO TABS
20.0000 mg | ORAL_TABLET | Freq: Every day | ORAL | Status: DC
  Administered 2023-12-30: 20 mg via ORAL
  Filled 2023-12-29: qty 1

## 2023-12-29 MED ORDER — LOPERAMIDE HCL 2 MG PO CAPS
2.0000 mg | ORAL_CAPSULE | Freq: Four times a day (QID) | ORAL | Status: DC | PRN
  Administered 2023-12-29: 2 mg via ORAL
  Filled 2023-12-29: qty 1

## 2023-12-29 NOTE — Care Management Important Message (Signed)
 Important Message  Patient Details  Name: Jesse Nelson MRN: [id number] Date of Birth: 22-Feb-1971   Important Message Given:  Yes - Medicare IM     Jesse Nelson 12/29/2023, 3:29 PM

## 2023-12-29 NOTE — Progress Notes (Signed)
 PROGRESS NOTE    Jesse Nelson  FMW:[id number] DOB: 1971-01-19 DOA: 12/27/2023 PCP: [name], MD  Chief Complaint  Patient presents with   Abdominal Pain    Hospital Course:  Jesse Nelson is a 53 y.o. Caucasian male with medical history significant for asthma, coronary artery disease, GERD, hypertension and dyslipidemia, who presented to the emergency room with acute onset of recurrent nausea and vomiting with associated diarrhea which have been going on since Wednesday.  Quit drinking alcohol about 6 days ago prior to presentation due to nausea Admitted for AKI, Nephrology following  9/24: C.Diff neg, imodium  prn added for diarrhea. Serology w/up ordered by Nephro   Subjective:  Reports diarrhea, family at bedside   Objective: Vitals:   12/29/23 0723 12/29/23 1113 12/29/23 1610 12/29/23 1951  BP: (!) 121/57 (!) 116/59 132/79 126/65  Pulse: 96 91 93 88  Resp:    19  Temp: 98.4 F (36.9 C) 98.2 F (36.8 C) 98.2 F (36.8 C) 99.2 F (37.3 C)  TempSrc: Oral Oral Oral Oral  SpO2: 98% 96% 98% 99%  Weight:      Height:        Intake/Output Summary (Last 24 hours) at 12/29/2023 2151 Last data filed at 12/29/2023 1943 Gross per 24 hour  Intake 2573.56 ml  Output 1500 ml  Net 1073.56 ml   Filed Weights   12/27/23 1648 12/28/23 1300  Weight: 61.7 kg 68.3 kg    Examination: GENERAL:  comfortable, no acute distress NECK:  Supple, no jugular venous distention. No thyroid enlargement, no tenderness.  LUNGS: Normal breath sounds bilaterally, no wheezing  CARDIOVASCULAR: Regular rate and rhythm, S1, S2 normal. No murmurs, rubs, or gallops.  ABDOMEN: Soft, nondistended, nontender. Bowel sounds present EXTREMITIES: No pedal edema, cyanosis, or clubbing NEUROLOGIC: AO x 3, no gross focal deficits  Assessment & Plan:  Principal Problem:   AKI (acute kidney injury) Active Problems:   GERD without esophagitis   Essential hypertension   Dyslipidemia   Asthma,  chronic  AKI (acute kidney injury) HAGMA - no acute need for HD but kidney function still remains high Lab Results  Component Value Date   CREATININE 9.56 (H) 12/29/2023   CREATININE 11.88 (H) 12/28/2023   CREATININE 14.21 (H) 12/27/2023    - CT Renal no acute findings - Continue IV fluids, encourage po intake - po bicarbs tabs - Nephrology following, appreciate recs.  - Nephro ordered serologies including ANA, ANCA, C3 and C4 complements, kappa lambda ratios, SPEP and UPEP pending  Hypokalemia Hypocalcemia Replete and recheck  Normocytic anemia - stable H & H  Alcohol use - Drinks 6-8 beer daily, last drink 09/17 - Denies alcohol withdrawal in the past   Asthma, not in exacerbation - Singulair  and as needed albuterol .   Dyslipidemia - hold off statin therapy given elevated CK   Essential hypertension Hypotension - Hold amlodipine , Lisinopril , hydrochlorothiazide , Metoprolol  due to borderline low BP   GERD without esophagitis - continue PPI therapy and H2 blockers  Diarrhea On & off since last 7-10 days.  Evonne c. Diff neg. Prn imodium   DVT prophylaxis: Heparin  SQ   Code Status: Full Code Disposition:  Home  Consultants:  Nephrology  Procedures:  None  Antimicrobials:  Anti-infectives (From admission, onward)    None       Data Reviewed: I have personally reviewed following labs and imaging studies CBC: Recent Labs  Lab 12/27/23 1649 12/28/23 0253 12/29/23 0324  WBC 7.4 5.9 6.6  HGB 13.1 10.3* 11.6*  HCT 35.9* 29.0* 32.6*  MCV 87.1 88.7 87.6  PLT 257 221 289   Basic Metabolic Panel: Recent Labs  Lab 12/27/23 1649 12/28/23 0253 12/28/23 2101 12/29/23 0324  NA 130* 136  --  135  K 3.5 3.0* 3.7 3.4*  CL 92* 107  --  107  CO2 11* 12*  --  12*  GLUCOSE 122* 73  --  95  BUN 97* 83*  --  91*  CREATININE 14.21* 11.88*  --  9.56*  CALCIUM  7.6* 5.4*  --  7.3*  MG  --  1.7  --   --   PHOS  --  8.0*  --   --    GFR: Estimated  Creatinine Clearance: 8.6 mL/min (A) (by C-G formula based on SCr of 9.56 mg/dL (H)). Liver Function Tests: Recent Labs  Lab 12/27/23 1649 12/28/23 0253  AST 14*  --   ALT 7  --   ALKPHOS 47  --   BILITOT 0.7  --   PROT 7.1  --   ALBUMIN 3.1* 2.2*   CBG: No results for input(s): GLUCAP in the last 168 hours.  Recent Results (from the past 240 hours)  Resp panel by RT-PCR (RSV, Flu A&B, Covid) Anterior Nasal Swab     Status: None   Collection Time: 12/27/23  5:27 PM   Specimen: Anterior Nasal Swab  Result Value Ref Range Status   SARS Coronavirus 2 by RT PCR NEGATIVE NEGATIVE Final    Comment: (NOTE) SARS-CoV-2 target nucleic acids are NOT DETECTED.  The SARS-CoV-2 RNA is generally detectable in upper respiratory specimens during the acute phase of infection. The lowest concentration of SARS-CoV-2 viral copies this assay can detect is 138 copies/mL. A negative result does not preclude SARS-Cov-2 infection and should not be used as the sole basis for treatment or other patient management decisions. A negative result may occur with  improper specimen collection/handling, submission of specimen other than nasopharyngeal swab, presence of viral mutation(s) within the areas targeted by this assay, and inadequate number of viral copies(<138 copies/mL). A negative result must be combined with clinical observations, patient history, and epidemiological information. The expected result is Negative.  Fact Sheet for Patients:  BloggerCourse.com  Fact Sheet for Healthcare Providers:  SeriousBroker.it  This test is no t yet approved or cleared by the [place]  FDA and  has been authorized for detection and/or diagnosis of SARS-CoV-2 by FDA under an Emergency Use Authorization (EUA). This EUA will remain  in effect (meaning this test can be used) for the duration of the COVID-19 declaration under Section 564(b)(1) of the Act,  21 U.S.C.section 360bbb-3(b)(1), unless the authorization is terminated  or revoked sooner.       Influenza A by PCR NEGATIVE NEGATIVE Final   Influenza B by PCR NEGATIVE NEGATIVE Final    Comment: (NOTE) The Xpert Xpress SARS-CoV-2/FLU/RSV plus assay is intended as an aid in the diagnosis of influenza from Nasopharyngeal swab specimens and should not be used as a sole basis for treatment. Nasal washings and aspirates are unacceptable for Xpert Xpress SARS-CoV-2/FLU/RSV testing.  Fact Sheet for Patients: BloggerCourse.com  Fact Sheet for Healthcare Providers: SeriousBroker.it  This test is not yet approved or cleared by the [place]  FDA and has been authorized for detection and/or diagnosis of SARS-CoV-2 by FDA under an Emergency Use Authorization (EUA). This EUA will remain in effect (meaning this test can be used) for the duration of the COVID-19 declaration under  Section 564(b)(1) of the Act, 21 U.S.C. section 360bbb-3(b)(1), unless the authorization is terminated or revoked.     Resp Syncytial Virus by PCR NEGATIVE NEGATIVE Final    Comment: (NOTE) Fact Sheet for Patients: BloggerCourse.com  Fact Sheet for Healthcare Providers: SeriousBroker.it  This test is not yet approved or cleared by the [place]  FDA and has been authorized for detection and/or diagnosis of SARS-CoV-2 by FDA under an Emergency Use Authorization (EUA). This EUA will remain in effect (meaning this test can be used) for the duration of the COVID-19 declaration under Section 564(b)(1) of the Act, 21 U.S.C. section 360bbb-3(b)(1), unless the authorization is terminated or revoked.  Performed at [facility], [address] [place], [place] [address]   C Difficile Quick Screen w PCR reflex     Status: None   Collection Time: 12/29/23  2:13 PM   Specimen: STOOL  Result Value Ref  Range Status   C Diff antigen NEGATIVE NEGATIVE Final   C Diff toxin NEGATIVE NEGATIVE Final   C Diff interpretation No C. difficile detected.  Final    Comment: Performed at [facility], [address], [place], [place] [address]     Radiology Studies: No results found.   Scheduled Meds:  clopidogrel   75 mg Oral Daily   cyanocobalamin   1,000 mcg Oral Q1500   [START ON 12/30/2023] famotidine   20 mg Oral Daily   folic acid   1 mg Oral Daily   heparin   5,000 Units Subcutaneous Q8H   influenza vac split trivalent PF  0.5 mL Intramuscular Tomorrow-1000   montelukast   10 mg Oral q morning   multivitamin with minerals  1 tablet Oral Daily   pantoprazole   40 mg Oral Daily   sodium bicarbonate   1,300 mg Oral BID   thiamine   100 mg Oral Daily   Continuous Infusions:  lactated ringers  75 mL/hr at 12/29/23 1943    Time Spent: 35 mins   LOS: 2 days  MDM: Patient is high risk for one or more organ failure.  They necessitate ongoing hospitalization for continued IV therapies and subsequent lab monitoring. Total time spent interpreting labs and vitals, reviewing the medical record, coordinating care amongst consultants and care team members, directly assessing and discussing care with the patient and/or family: 55 min Kindred Reidinger,MD [facility]  To contact the attending physician between 7A-7P please use Epic Chat. To contact the covering physician during after hours 7P-7A, please review Amion.  12/29/2023, 9:51 PM   *This document has been created with the assistance of dictation software. Please excuse typographical errors. *

## 2023-12-29 NOTE — Progress Notes (Addendum)
 [facility]  ROUNDING NOTE   Subjective:   Patient seen resting in bed Alert and oriented Nausea has improved Continues to have diarrhea  Creatinine 9.58 Urine output 900 mL overnight  Objective:  Vital signs in last 24 hours:  Temp:  [97.5 F (36.4 C)-98.9 F (37.2 C)] 98.2 F (36.8 C) (09/24 1113) Pulse Rate:  [82-96] 91 (09/24 1113) Resp:  [16-19] 19 (09/24 0345) BP: (99-125)/(52-59) 116/59 (09/24 1113) SpO2:  [96 %-100 %] 96 % (09/24 1113)  Weight change: 6.623 kg Filed Weights   12/27/23 1648 12/28/23 1300  Weight: 61.7 kg 68.3 kg    Intake/Output: I/O last 3 completed shifts: In: 1766.7 [I.V.:766.7; IV Piggyback:1000] Out: 900 [Urine:900]   Intake/Output this shift:  Total I/O In: 240 [P.O.:240] Out: 600 [Urine:600]  Physical Exam: General: NAD  Head: Normocephalic, atraumatic. Moist oral mucosal membranes  Eyes: Anicteric  Lungs:  Clear to auscultation, normal effort  Heart: Regular rate and rhythm  Abdomen:  Soft, nontender  Extremities: No peripheral edema.  Neurologic: Awake, alert, conversant  Skin: Warm,dry, no rash       Basic Metabolic Panel: Recent Labs  Lab 12/27/23 1649 12/28/23 0253 12/28/23 2101 12/29/23 0324  NA 130* 136  --  135  K 3.5 3.0* 3.7 3.4*  CL 92* 107  --  107  CO2 11* 12*  --  12*  GLUCOSE 122* 73  --  95  BUN 97* 83*  --  91*  CREATININE 14.21* 11.88*  --  9.56*  CALCIUM  7.6* 5.4*  --  7.3*  MG  --  1.7  --   --   PHOS  --  8.0*  --   --     Liver Function Tests: Recent Labs  Lab 12/27/23 1649 12/28/23 0253  AST 14*  --   ALT 7  --   ALKPHOS 47  --   BILITOT 0.7  --   PROT 7.1  --   ALBUMIN 3.1* 2.2*   No results for input(s): LIPASE, AMYLASE in the last 168 hours. No results for input(s): AMMONIA in the last 168 hours.  CBC: Recent Labs  Lab 12/27/23 1649 12/28/23 0253 12/29/23 0324  WBC 7.4 5.9 6.6  HGB 13.1 10.3* 11.6*  HCT 35.9* 29.0* 32.6*  MCV 87.1 88.7 87.6  PLT  257 221 289    Cardiac Enzymes: Recent Labs  Lab 12/27/23 1649  CKTOTAL 439*    BNP: Invalid input(s): POCBNP  CBG: No results for input(s): GLUCAP in the last 168 hours.  Microbiology: Results for orders placed or performed during the hospital encounter of 12/27/23  Resp panel by RT-PCR (RSV, Flu A&B, Covid) Anterior Nasal Swab     Status: None   Collection Time: 12/27/23  5:27 PM   Specimen: Anterior Nasal Swab  Result Value Ref Range Status   SARS Coronavirus 2 by RT PCR NEGATIVE NEGATIVE Final    Comment: (NOTE) SARS-CoV-2 target nucleic acids are NOT DETECTED.  The SARS-CoV-2 RNA is generally detectable in upper respiratory specimens during the acute phase of infection. The lowest concentration of SARS-CoV-2 viral copies this assay can detect is 138 copies/mL. A negative result does not preclude SARS-Cov-2 infection and should not be used as the sole basis for treatment or other patient management decisions. A negative result may occur with  improper specimen collection/handling, submission of specimen other than nasopharyngeal swab, presence of viral mutation(s) within the areas targeted by this assay, and inadequate number of viral copies(<138 copies/mL).  A negative result must be combined with clinical observations, patient history, and epidemiological information. The expected result is Negative.  Fact Sheet for Patients:  BloggerCourse.com  Fact Sheet for Healthcare Providers:  SeriousBroker.it  This test is no t yet approved or cleared by the [place]  FDA and  has been authorized for detection and/or diagnosis of SARS-CoV-2 by FDA under an Emergency Use Authorization (EUA). This EUA will remain  in effect (meaning this test can be used) for the duration of the COVID-19 declaration under Section 564(b)(1) of the Act, 21 U.S.C.section 360bbb-3(b)(1), unless the authorization is terminated  or revoked  sooner.       Influenza A by PCR NEGATIVE NEGATIVE Final   Influenza B by PCR NEGATIVE NEGATIVE Final    Comment: (NOTE) The Xpert Xpress SARS-CoV-2/FLU/RSV plus assay is intended as an aid in the diagnosis of influenza from Nasopharyngeal swab specimens and should not be used as a sole basis for treatment. Nasal washings and aspirates are unacceptable for Xpert Xpress SARS-CoV-2/FLU/RSV testing.  Fact Sheet for Patients: BloggerCourse.com  Fact Sheet for Healthcare Providers: SeriousBroker.it  This test is not yet approved or cleared by the [place]  FDA and has been authorized for detection and/or diagnosis of SARS-CoV-2 by FDA under an Emergency Use Authorization (EUA). This EUA will remain in effect (meaning this test can be used) for the duration of the COVID-19 declaration under Section 564(b)(1) of the Act, 21 U.S.C. section 360bbb-3(b)(1), unless the authorization is terminated or revoked.     Resp Syncytial Virus by PCR NEGATIVE NEGATIVE Final    Comment: (NOTE) Fact Sheet for Patients: BloggerCourse.com  Fact Sheet for Healthcare Providers: SeriousBroker.it  This test is not yet approved or cleared by the [place]  FDA and has been authorized for detection and/or diagnosis of SARS-CoV-2 by FDA under an Emergency Use Authorization (EUA). This EUA will remain in effect (meaning this test can be used) for the duration of the COVID-19 declaration under Section 564(b)(1) of the Act, 21 U.S.C. section 360bbb-3(b)(1), unless the authorization is terminated or revoked.  Performed at [facility], [address] [place], [place] [address]     Coagulation Studies: No results for input(s): LABPROT, INR in the last 72 hours.  Urinalysis: Recent Labs    12/28/23 0836  COLORURINE YELLOW*  LABSPEC 1.013  PHURINE 5.0  GLUCOSEU NEGATIVE  HGBUR  SMALL*  BILIRUBINUR NEGATIVE  KETONESUR NEGATIVE  PROTEINUR 100*  NITRITE NEGATIVE  LEUKOCYTESUR NEGATIVE      Imaging: CT Renal Stone Study Result Date: 12/27/2023 EXAM: CT ABDOMEN AND PELVIS WITHOUT CONTRAST 12/27/2023 08:03:00 PM TECHNIQUE: CT of the abdomen and pelvis was performed without the administration of intravenous contrast. Multiplanar reformatted images are provided for review. Automated exposure control, iterative reconstruction, and/or weight-based adjustment of the mA/kV was utilized to reduce the radiation dose to as low as reasonably achievable. COMPARISON: None available. CLINICAL HISTORY: Abdominal/flank pain, stone suspected. N/V/D for the last five days. Pt sts that he has not urinated in the last three days. Pt sts before he stopped urinating his urine was dark like tea. Pt abd is distended. Per EMS pt was hypotensive on their arrival. EMS gave 1L of LR enroute to ED via; 20g in the RFA with 4mg  of zofran . FINDINGS: LOWER CHEST: Tiny hiatal hernia. LIVER: Mild hepatic steatosis. GALLBLADDER AND BILE DUCTS: Gallbladder is unremarkable. No biliary ductal dilatation. SPLEEN: No acute abnormality. PANCREAS: No acute abnormality. ADRENAL GLANDS: No acute abnormality. KIDNEYS, URETERS AND BLADDER: 11  mm simple right lower pole renal cyst (image 37), benign (Bosniak 1). No follow up is recommended. No stones in the kidneys or ureters. No hydronephrosis. No perinephric or periureteral stranding. Urinary bladder is unremarkable. GI AND BOWEL: Normal appendix (image 55). There is no bowel obstruction. PERITONEUM AND RETROPERITONEUM: No ascites. No free air. VASCULATURE: Atherosclerotic calcifications of the abdominal aorta and branch vessels. LYMPH NODES: No lymphadenopathy. REPRODUCTIVE ORGANS: Prostate is unremarkable. BONES AND SOFT TISSUES: Thoracic spine stimulator. Tiny fat-containing left inguinal hernia. No acute osseous abnormality. IMPRESSION: 1. No acute findings in the abdomen or  pelvis. 2. Mild hepatic steatosis. Electronically signed by: Pinkie Pebbles MD 12/27/2023 08:22 PM EDT RP Workstation: [id number]   DG Chest 2 View Result Date: 12/27/2023 EXAM: 2 VIEW(S) XRAY OF THE CHEST 12/27/2023 07:42:00 PM COMPARISON: 11/23/2007 CLINICAL HISTORY: Cough. The reason for exam is stated as cough. Patient also with N/V/D x 5 days, abdominal pain, and inability to urinate x 3 days. Hx of CAD, HTN, and current smoker of 1 pack a day. FINDINGS: LUNGS AND PLEURA: No focal pulmonary opacity. No pulmonary edema. No pleural effusion. No pneumothorax. HEART AND MEDIASTINUM: No acute abnormality of the cardiac and mediastinal silhouettes. BONES AND SOFT TISSUES: Osteolysis of the left distal clavicle. No acute osseous abnormality. IMPRESSION: 1. No acute cardiopulmonary process. Electronically signed by: Pinkie Pebbles MD 12/27/2023 08:16 PM EDT RP Workstation: [id number]     Medications:    lactated ringers  100 mL/hr at 12/29/23 9394    clopidogrel   75 mg Oral Daily   cyanocobalamin   1,000 mcg Oral Q1500   famotidine   20 mg Oral BID   folic acid   1 mg Oral Daily   heparin   5,000 Units Subcutaneous Q8H   influenza vac split trivalent PF  0.5 mL Intramuscular Tomorrow-1000   montelukast   10 mg Oral q morning   multivitamin with minerals  1 tablet Oral Daily   pantoprazole   40 mg Oral Daily   sodium bicarbonate   1,300 mg Oral BID   thiamine   100 mg Oral Daily   acetaminophen  **OR** acetaminophen , albuterol , loperamide , magnesium  hydroxide, nitroGLYCERIN , ondansetron  **OR** ondansetron  (ZOFRAN ) IV, oxyCODONE -acetaminophen  **AND** oxyCODONE , traZODone   Assessment/ Plan:  Mr. Jesse Nelson is a 53 y.o.  male  with past medical conditions including CAD, hypertension, GERD, and dyslipidemia, who was admitted to [facility] on 12/27/2023 for AKI (acute kidney injury) [N17.9] Acute renal failure, unspecified acute renal failure type [N17.9]   Acute kidney injury with mild proteinuria  likely secondary to severe dehydration.  Normal renal function noted in May of this year.  CT abdomen pelvis negative for renal obstruction.  No recent IV contrast exposure.   Creatinine has shown some improvement however remains quite elevated.  Decent urine output recorded overnight.  Will continue IV fluids for now, at decrease rate to 75 mL/h, as patient continues to experience GI losses.  No acute indication for dialysis.  Patient encouraged to maintain oral intake.  Will also order serologies including ANA, ANCA, C3 and C4 complements, kappa lambda ratios, SPEP and UPEP.  Lab Results  Component Value Date   CREATININE 9.56 (H) 12/29/2023   CREATININE 11.88 (H) 12/28/2023   CREATININE 14.21 (H) 12/27/2023    Intake/Output Summary (Last 24 hours) at 12/29/2023 1424 Last data filed at 12/29/2023 1232 Gross per 24 hour  Intake 240 ml  Output 1500 ml  Net -1260 ml    2.  Acute metabolic acidosis, serum bicarb 11 on ED arrival.  Continue oral sodium  bicarbonate supplementation at 1300 mg twice daily.  Will consider need of IV supplementation if levels are improving tomorrow.   3.  Hypocalcemia, S calcium  7.6 on ED arrival.  Patient did receive IV 2 g calcium  gluconate supplementation.  S calcium  7.3 today.   LOS: 2 Lorriann Hansmann 9/24/20252:24 PM

## 2023-12-29 NOTE — TOC CM/SW Note (Signed)
 Transition of Care [facility]) - Inpatient Brief Assessment   Patient Details  Name: Jesse Nelson MRN: [id number] Date of Birth: 1970-04-12  [facility]) CM/SW Contact:    Alfonso Rummer, LCSW Phone Number: 12/29/2023, 10:08 AM   Clinical Narrative:  Jesse Nelson Rummer completed TOC chart review. No TOC needs identified please contact should needs arise.   Transition of Care Asessment:

## 2023-12-29 NOTE — Plan of Care (Signed)
  Problem: Education: Goal: Knowledge of General Education information will improve Description: Including pain rating scale, medication(s)/side effects and non-pharmacologic comfort measures Outcome: Progressing   Problem: Health Behavior/Discharge Planning: Goal: Ability to manage health-related needs will improve Outcome: Progressing   Problem: Clinical Measurements: Goal: Ability to maintain clinical measurements within normal limits will improve Outcome: Progressing Goal: Will remain free from infection Outcome: Progressing Goal: Diagnostic test results will improve Outcome: Progressing Goal: Respiratory complications will improve Outcome: Progressing Goal: Cardiovascular complication will be avoided Outcome: Progressing   Problem: Activity: Goal: Risk for activity intolerance will decrease Outcome: Progressing   Problem: Nutrition: Goal: Adequate nutrition will be maintained Outcome: Progressing   Problem: Coping: Goal: Level of anxiety will decrease Outcome: Progressing   Problem: Elimination: Goal: Will not experience complications related to bowel motility Outcome: Progressing Goal: Will not experience complications related to urinary retention Outcome: Progressing   Problem: Pain Managment: Goal: General experience of comfort will improve and/or be controlled Outcome: Progressing   Problem: Safety: Goal: Ability to remain free from injury will improve Outcome: Progressing   Problem: Skin Integrity: Goal: Risk for impaired skin integrity will decrease Outcome: Progressing   Problem: Education: Goal: Knowledge of disease and its progression will improve Outcome: Progressing   Problem: Health Behavior/Discharge Planning: Goal: Ability to manage health-related needs will improve Outcome: Progressing   Problem: Clinical Measurements: Goal: Complications related to the disease process or treatment will be avoided or minimized Outcome:  Progressing Goal: Dialysis access will remain free of complications Outcome: Progressing   Problem: Activity: Goal: Activity intolerance will improve Outcome: Progressing   Problem: Fluid Volume: Goal: Fluid volume balance will be maintained or improved Outcome: Progressing   Problem: Nutritional: Goal: Ability to make appropriate dietary choices will improve Outcome: Progressing   Problem: Respiratory: Goal: Respiratory symptoms related to disease process will be avoided Outcome: Progressing   Problem: Self-Concept: Goal: Body image disturbance will be avoided or minimized Outcome: Progressing   Problem: Urinary Elimination: Goal: Progression of disease will be identified and treated Outcome: Progressing

## 2023-12-29 NOTE — Progress Notes (Signed)
 PHARMACY CONSULT NOTE - FOLLOW UP  Pharmacy Consult for Electrolyte Monitoring and Replacement   Recent Labs: Potassium (mmol/L)  Date Value  12/29/2023 3.4 (L)   Magnesium  (mg/dL)  Date Value  90/76/7974 1.7   Calcium  (mg/dL)  Date Value  90/75/7974 7.3 (L)   Albumin (g/dL)  Date Value  90/76/7974 2.2 (L)  11/19/2023 4.3   Phosphorus (mg/dL)  Date Value  90/76/7974 8.0 (H)   Sodium (mmol/L)  Date Value  12/29/2023 135  08/16/2023 140     Assessment: 9/24:  K @ 0324 = 3.4  - no KCl supplementation given since then   Goal of Therapy:  Electrolytes WNL   Plan:  Will order KCl 20 mEq PO X 1 for 9/24 @ ~ 2230 - recheck BMP on 9/25 with AM labs   Sanskriti Greenlaw D ,PharmD Clinical Pharmacist 12/29/2023 10:08 PM

## 2023-12-30 DIAGNOSIS — N179 Acute kidney failure, unspecified: Secondary | ICD-10-CM | POA: Diagnosis not present

## 2023-12-30 DIAGNOSIS — I2510 Atherosclerotic heart disease of native coronary artery without angina pectoris: Secondary | ICD-10-CM | POA: Diagnosis not present

## 2023-12-30 DIAGNOSIS — K219 Gastro-esophageal reflux disease without esophagitis: Secondary | ICD-10-CM | POA: Diagnosis not present

## 2023-12-30 DIAGNOSIS — J45909 Unspecified asthma, uncomplicated: Secondary | ICD-10-CM | POA: Diagnosis not present

## 2023-12-30 LAB — ANCA PROFILE
Anti-MPO Antibodies: <0.2 U (ref 0.0–0.9)
Anti-PR3 Antibodies: <0.2 U (ref 0.0–0.9)
Atypical P-ANCA titer: <1:20 {titer}
C-ANCA: <1:20 {titer}
P-ANCA: <1:20 {titer}

## 2023-12-30 LAB — CBC
HCT: 32.7 % — ABNORMAL LOW (ref 39.0–52.0)
Hemoglobin: 11.6 g/dL — ABNORMAL LOW (ref 13.0–17.0)
MCH: 30.8 pg (ref 26.0–34.0)
MCHC: 35.5 g/dL (ref 30.0–36.0)
MCV: 86.7 fL (ref 80.0–100.0)
Platelets: 336 K/uL (ref 150–400)
RBC: 3.77 MIL/uL — ABNORMAL LOW (ref 4.22–5.81)
RDW: 12.9 % (ref 11.5–15.5)
WBC: 6.9 K/uL (ref 4.0–10.5)
nRBC: 0 % (ref 0.0–0.2)

## 2023-12-30 LAB — BASIC METABOLIC PANEL WITH GFR
Anion gap: 11 (ref 5–15)
BUN: 64 mg/dL — ABNORMAL HIGH (ref 6–20)
CO2: 20 mmol/L — ABNORMAL LOW (ref 22–32)
Calcium: 8.9 mg/dL (ref 8.9–10.3)
Chloride: 110 mmol/L (ref 98–111)
Creatinine, Ser: 3.1 mg/dL — ABNORMAL HIGH (ref 0.61–1.24)
GFR, Estimated: 23 mL/min — ABNORMAL LOW (ref >60)
Glucose, Bld: 92 mg/dL (ref 70–99)
Potassium: 4.2 mmol/L (ref 3.5–5.1)
Sodium: 141 mmol/L (ref 135–145)

## 2023-12-30 LAB — KAPPA/LAMBDA LIGHT CHAINS
Kappa free light chain: 54.1 mg/L — ABNORMAL HIGH (ref 3.3–19.4)
Kappa, lambda light chain ratio: 1.49 (ref 0.26–1.65)
Lambda free light chains: 36.3 mg/L — ABNORMAL HIGH (ref 5.7–26.3)

## 2023-12-30 LAB — CREATININE, SERUM
Creatinine, Ser: 2 mg/dL — ABNORMAL HIGH (ref 0.61–1.24)
GFR, Estimated: 39 mL/min — ABNORMAL LOW (ref >60)

## 2023-12-30 LAB — C3 COMPLEMENT: C3 Complement: 179 mg/dL — ABNORMAL HIGH (ref 82–167)

## 2023-12-30 LAB — C4 COMPLEMENT: Complement C4, Body Fluid: 48 mg/dL — ABNORMAL HIGH (ref 12–38)

## 2023-12-30 MED ORDER — FAMOTIDINE 20 MG PO TABS: 20.0000 mg | ORAL_TABLET | Freq: Every day | ORAL | Status: AC

## 2023-12-30 MED ORDER — ACETAMINOPHEN 650 MG RE SUPP: 650.0000 mg | Freq: Four times a day (QID) | RECTAL | Status: DC | PRN

## 2023-12-30 MED ORDER — ACETAMINOPHEN 325 MG PO TABS: 650.0000 mg | ORAL_TABLET | Freq: Four times a day (QID) | ORAL | Status: DC | PRN

## 2023-12-30 NOTE — Progress Notes (Deleted)
 Patient is being discharged home. Patient to be transported by his wife. IV removed with the catheter intact. Patient educated on the need for oxygen to transport and is refusing portable oxygen. Discharge instructions and prescription information given to the patient who verbalizes understanding.

## 2023-12-30 NOTE — Progress Notes (Signed)
Patient being discharged home.  Patient to be transported by his wife.  IV removed with the catheter intact.  Discharge instructions and prescription information given to the patient who verbalized understanding. 

## 2023-12-30 NOTE — Progress Notes (Signed)
 [facility]  ROUNDING NOTE   Subjective:   Patient seen ambulating in room  Room air States appetite is appropriate  Creatinine 3.10 Urine output 1800 mL overnight  Objective:  Vital signs in last 24 hours:  Temp:  [98.2 F (36.8 C)-99.2 F (37.3 C)] 98.6 F (37 C) (09/25 1206) Pulse Rate:  [80-97] 85 (09/25 1206) Resp:  [17-19] 17 (09/25 0800) BP: (117-146)/(65-79) 117/73 (09/25 1206) SpO2:  [96 %-99 %] 99 % (09/25 1206)  Weight change:  Filed Weights   12/27/23 1648 12/28/23 1300  Weight: 61.7 kg 68.3 kg    Intake/Output: I/O last 3 completed shifts: In: 3158.6 [P.O.:420; I.V.:2738.6] Out: 2700 [Urine:2700]   Intake/Output this shift:  Total I/O In: 882.9 [P.O.:240; I.V.:642.9] Out: 600 [Urine:600]  Physical Exam: General: NAD  Head: Normocephalic, atraumatic. Moist oral mucosal membranes  Eyes: Anicteric  Lungs:  Clear to auscultation, normal effort  Heart: Regular rate and rhythm  Abdomen:  Soft, nontender  Extremities: No peripheral edema.  Neurologic: Awake, alert, conversant  Skin: Warm,dry, no rash       Basic Metabolic Panel: Recent Labs  Lab 12/27/23 1649 12/28/23 0253 12/28/23 2101 12/29/23 0324 12/30/23 0328  NA 130* 136  --  135 141  K 3.5 3.0* 3.7 3.4* 4.2  CL 92* 107  --  107 110  CO2 11* 12*  --  12* 20*  GLUCOSE 122* 73  --  95 92  BUN 97* 83*  --  91* 64*  CREATININE 14.21* 11.88*  --  9.56* 3.10*  CALCIUM  7.6* 5.4*  --  7.3* 8.9  MG  --  1.7  --   --   --   PHOS  --  8.0*  --   --   --     Liver Function Tests: Recent Labs  Lab 12/27/23 1649 12/28/23 0253  AST 14*  --   ALT 7  --   ALKPHOS 47  --   BILITOT 0.7  --   PROT 7.1  --   ALBUMIN 3.1* 2.2*   No results for input(s): LIPASE, AMYLASE in the last 168 hours. No results for input(s): AMMONIA in the last 168 hours.  CBC: Recent Labs  Lab 12/27/23 1649 12/28/23 0253 12/29/23 0324 12/30/23 0328  WBC 7.4 5.9 6.6 6.9  HGB 13.1 10.3*  11.6* 11.6*  HCT 35.9* 29.0* 32.6* 32.7*  MCV 87.1 88.7 87.6 86.7  PLT 257 221 289 336    Cardiac Enzymes: Recent Labs  Lab 12/27/23 1649  CKTOTAL 439*    BNP: Invalid input(s): POCBNP  CBG: No results for input(s): GLUCAP in the last 168 hours.  Microbiology: Results for orders placed or performed during the hospital encounter of 12/27/23  Resp panel by RT-PCR (RSV, Flu A&B, Covid) Anterior Nasal Swab     Status: None   Collection Time: 12/27/23  5:27 PM   Specimen: Anterior Nasal Swab  Result Value Ref Range Status   SARS Coronavirus 2 by RT PCR NEGATIVE NEGATIVE Final    Comment: (NOTE) SARS-CoV-2 target nucleic acids are NOT DETECTED.  The SARS-CoV-2 RNA is generally detectable in upper respiratory specimens during the acute phase of infection. The lowest concentration of SARS-CoV-2 viral copies this assay can detect is 138 copies/mL. A negative result does not preclude SARS-Cov-2 infection and should not be used as the sole basis for treatment or other patient management decisions. A negative result may occur with  improper specimen collection/handling, submission of specimen other than nasopharyngeal  swab, presence of viral mutation(s) within the areas targeted by this assay, and inadequate number of viral copies(<138 copies/mL). A negative result must be combined with clinical observations, patient history, and epidemiological information. The expected result is Negative.  Fact Sheet for Patients:  BloggerCourse.com  Fact Sheet for Healthcare Providers:  SeriousBroker.it  This test is no t yet approved or cleared by the [place]  FDA and  has been authorized for detection and/or diagnosis of SARS-CoV-2 by FDA under an Emergency Use Authorization (EUA). This EUA will remain  in effect (meaning this test can be used) for the duration of the COVID-19 declaration under Section 564(b)(1) of the Act,  21 U.S.C.section 360bbb-3(b)(1), unless the authorization is terminated  or revoked sooner.       Influenza A by PCR NEGATIVE NEGATIVE Final   Influenza B by PCR NEGATIVE NEGATIVE Final    Comment: (NOTE) The Xpert Xpress SARS-CoV-2/FLU/RSV plus assay is intended as an aid in the diagnosis of influenza from Nasopharyngeal swab specimens and should not be used as a sole basis for treatment. Nasal washings and aspirates are unacceptable for Xpert Xpress SARS-CoV-2/FLU/RSV testing.  Fact Sheet for Patients: BloggerCourse.com  Fact Sheet for Healthcare Providers: SeriousBroker.it  This test is not yet approved or cleared by the [place]  FDA and has been authorized for detection and/or diagnosis of SARS-CoV-2 by FDA under an Emergency Use Authorization (EUA). This EUA will remain in effect (meaning this test can be used) for the duration of the COVID-19 declaration under Section 564(b)(1) of the Act, 21 U.S.C. section 360bbb-3(b)(1), unless the authorization is terminated or revoked.     Resp Syncytial Virus by PCR NEGATIVE NEGATIVE Final    Comment: (NOTE) Fact Sheet for Patients: BloggerCourse.com  Fact Sheet for Healthcare Providers: SeriousBroker.it  This test is not yet approved or cleared by the [place]  FDA and has been authorized for detection and/or diagnosis of SARS-CoV-2 by FDA under an Emergency Use Authorization (EUA). This EUA will remain in effect (meaning this test can be used) for the duration of the COVID-19 declaration under Section 564(b)(1) of the Act, 21 U.S.C. section 360bbb-3(b)(1), unless the authorization is terminated or revoked.  Performed at [facility], [address] [place], [place] [address]   C Difficile Quick Screen w PCR reflex     Status: None   Collection Time: 12/29/23  2:13 PM   Specimen: STOOL  Result Value Ref  Range Status   C Diff antigen NEGATIVE NEGATIVE Final   C Diff toxin NEGATIVE NEGATIVE Final   C Diff interpretation No C. difficile detected.  Final    Comment: Performed at [facility], [address] [place], [place] [address]    Coagulation Studies: No results for input(s): LABPROT, INR in the last 72 hours.  Urinalysis: Recent Labs    12/28/23 0836  COLORURINE YELLOW*  LABSPEC 1.013  PHURINE 5.0  GLUCOSEU NEGATIVE  HGBUR SMALL*  BILIRUBINUR NEGATIVE  KETONESUR NEGATIVE  PROTEINUR 100*  NITRITE NEGATIVE  LEUKOCYTESUR NEGATIVE      Imaging: No results found.    Medications:    lactated ringers  75 mL/hr at 12/30/23 1218    clopidogrel   75 mg Oral Daily   cyanocobalamin   1,000 mcg Oral Q1500   famotidine   20 mg Oral Daily   folic acid   1 mg Oral Daily   heparin   5,000 Units Subcutaneous Q8H   influenza vac split trivalent PF  0.5 mL Intramuscular Tomorrow-1000   montelukast   10  mg Oral q morning   multivitamin with minerals  1 tablet Oral Daily   pantoprazole   40 mg Oral Daily   sodium bicarbonate   1,300 mg Oral BID   thiamine   100 mg Oral Daily   acetaminophen  **OR** acetaminophen , albuterol , loperamide , magnesium  hydroxide, nitroGLYCERIN , ondansetron  **OR** ondansetron  (ZOFRAN ) IV, oxyCODONE -acetaminophen  **AND** oxyCODONE , traZODone   Assessment/ Plan:  Mr. Jesse Nelson is a 53 y.o.  male  with past medical conditions including CAD, hypertension, GERD, and dyslipidemia, who was admitted to [facility] on 12/27/2023 for AKI (acute kidney injury) [N17.9] Acute renal failure, unspecified acute renal failure type [N17.9]   Acute kidney injury with mild proteinuria likely secondary to severe dehydration.  Normal renal function noted in May of this year.  CT abdomen pelvis negative for renal obstruction.  No recent IV contrast exposure.   Creatinine has improved greatly. Will recheck renal function this afternoon, if below 3, can discharge. Will  followup in our office.  Serologies pending: ANA, ANCA, C3 and C4 complements, kappa lambda ratios, SPEP and UPEP.  Lab Results  Component Value Date   CREATININE 3.10 (H) 12/30/2023   CREATININE 9.56 (H) 12/29/2023   CREATININE 11.88 (H) 12/28/2023    Intake/Output Summary (Last 24 hours) at 12/30/2023 1238 Last data filed at 12/30/2023 1218 Gross per 24 hour  Intake 3801.44 ml  Output 1800 ml  Net 2001.44 ml    2.  Acute metabolic acidosis, serum bicarb 11 on ED arrival.  Continue oral sodium bicarbonate  supplementation at 1300 mg twice daily.  Improving with oral supplementation and kidney function.    3.  Hypocalcemia, S calcium  7.6 on ED arrival.   IV 2 g calcium  gluconate supplementation on 9/24.  corrected   LOS: 3 Monserrat Vidaurri 9/25/202512:38 PM

## 2023-12-30 NOTE — Plan of Care (Signed)
   Problem: Education: Goal: Knowledge of General Education information will improve Description Including pain rating scale, medication(s)/side effects and non-pharmacologic comfort measures Outcome: Progressing

## 2023-12-30 NOTE — Plan of Care (Signed)
 Problem: Education: Goal: Knowledge of General Education information will improve Description: Including pain rating scale, medication(s)/side effects and non-pharmacologic comfort measures 12/30/2023 1528 by Les Delon CROME, RN Outcome: Adequate for Discharge 12/30/2023 1528 by Les Delon CROME, RN Outcome: Adequate for Discharge 12/30/2023 0931 by Les Delon CROME, RN Outcome: Progressing   Problem: Health Behavior/Discharge Planning: Goal: Ability to manage health-related needs will improve 12/30/2023 1528 by Les Delon CROME, RN Outcome: Adequate for Discharge 12/30/2023 1528 by Les Delon CROME, RN Outcome: Adequate for Discharge   Problem: Clinical Measurements: Goal: Ability to maintain clinical measurements within normal limits will improve 12/30/2023 1528 by Les Delon CROME, RN Outcome: Adequate for Discharge 12/30/2023 1528 by Les Delon CROME, RN Outcome: Adequate for Discharge Goal: Will remain free from infection 12/30/2023 1528 by Les Delon CROME, RN Outcome: Adequate for Discharge 12/30/2023 1528 by Les Delon CROME, RN Outcome: Adequate for Discharge Goal: Diagnostic test results will improve 12/30/2023 1528 by Les Delon CROME, RN Outcome: Adequate for Discharge 12/30/2023 1528 by Les Delon CROME, RN Outcome: Adequate for Discharge Goal: Respiratory complications will improve 12/30/2023 1528 by Les Delon CROME, RN Outcome: Adequate for Discharge 12/30/2023 1528 by Les Delon CROME, RN Outcome: Adequate for Discharge Goal: Cardiovascular complication will be avoided 12/30/2023 1528 by Les Delon CROME, RN Outcome: Adequate for Discharge 12/30/2023 1528 by Les Delon CROME, RN Outcome: Adequate for Discharge   Problem: Activity: Goal: Risk for activity intolerance will decrease 12/30/2023 1528 by Les Delon CROME, RN Outcome: Adequate for Discharge 12/30/2023 1528 by  Les Delon CROME, RN Outcome: Adequate for Discharge   Problem: Nutrition: Goal: Adequate nutrition will be maintained 12/30/2023 1528 by Les Delon CROME, RN Outcome: Adequate for Discharge 12/30/2023 1528 by Les Delon CROME, RN Outcome: Adequate for Discharge   Problem: Coping: Goal: Level of anxiety will decrease 12/30/2023 1528 by Les Delon CROME, RN Outcome: Adequate for Discharge 12/30/2023 1528 by Les Delon CROME, RN Outcome: Adequate for Discharge   Problem: Pain Managment: Goal: General experience of comfort will improve and/or be controlled 12/30/2023 1528 by Les Delon CROME, RN Outcome: Adequate for Discharge 12/30/2023 1528 by Les Delon CROME, RN Outcome: Adequate for Discharge   Problem: Safety: Goal: Ability to remain free from injury will improve 12/30/2023 1528 by Les Delon CROME, RN Outcome: Adequate for Discharge 12/30/2023 1528 by Les Delon CROME, RN Outcome: Adequate for Discharge   Problem: Skin Integrity: Goal: Risk for impaired skin integrity will decrease 12/30/2023 1528 by Les Delon CROME, RN Outcome: Adequate for Discharge 12/30/2023 1528 by Les Delon CROME, RN Outcome: Adequate for Discharge   Problem: Education: Goal: Knowledge of disease and its progression will improve 12/30/2023 1528 by Les Delon CROME, RN Outcome: Adequate for Discharge 12/30/2023 1528 by Les Delon CROME, RN Outcome: Adequate for Discharge   Problem: Health Behavior/Discharge Planning: Goal: Ability to manage health-related needs will improve 12/30/2023 1528 by Les Delon CROME, RN Outcome: Adequate for Discharge 12/30/2023 1528 by Les Delon CROME, RN Outcome: Adequate for Discharge   Problem: Clinical Measurements: Goal: Complications related to the disease process or treatment will be avoided or minimized 12/30/2023 1528 by Les Delon CROME, RN Outcome: Adequate for  Discharge 12/30/2023 1528 by Les Delon CROME, RN Outcome: Adequate for Discharge Goal: Dialysis access will remain free of complications 12/30/2023 1528 by Les Delon CROME, RN Outcome: Adequate for Discharge 12/30/2023 1528 by Les Delon CROME, RN Outcome: Adequate for Discharge   Problem: Activity: Goal: Activity intolerance will improve 12/30/2023 1528 by Les Delon CROME,  RN Outcome: Adequate for Discharge 12/30/2023 1528 by Les Delon CROME, RN Outcome: Adequate for Discharge   Problem: Fluid Volume: Goal: Fluid volume balance will be maintained or improved 12/30/2023 1528 by Les Delon CROME, RN Outcome: Adequate for Discharge 12/30/2023 1528 by Les Delon CROME, RN Outcome: Adequate for Discharge   Problem: Nutritional: Goal: Ability to make appropriate dietary choices will improve 12/30/2023 1528 by Les Delon CROME, RN Outcome: Adequate for Discharge 12/30/2023 1528 by Les Delon CROME, RN Outcome: Adequate for Discharge   Problem: Self-Concept: Goal: Body image disturbance will be avoided or minimized 12/30/2023 1528 by Les Delon CROME, RN Outcome: Adequate for Discharge 12/30/2023 1528 by Les Delon CROME, RN Outcome: Adequate for Discharge   Problem: Respiratory: Goal: Respiratory symptoms related to disease process will be avoided 12/30/2023 1528 by Les Delon CROME, RN Outcome: Adequate for Discharge 12/30/2023 1528 by Les Delon CROME, RN Outcome: Adequate for Discharge   Problem: Urinary Elimination: Goal: Progression of disease will be identified and treated 12/30/2023 1528 by Les Delon CROME, RN Outcome: Adequate for Discharge 12/30/2023 1528 by Les Delon CROME, RN Outcome: Adequate for Discharge

## 2023-12-30 NOTE — Progress Notes (Signed)
 PHARMACY CONSULT NOTE - FOLLOW UP  Pharmacy Consult for Electrolyte Monitoring and Replacement   Recent Labs: Potassium (mmol/L)  Date Value  12/30/2023 4.2   Magnesium  (mg/dL)  Date Value  90/76/7974 1.7   Calcium  (mg/dL)  Date Value  90/74/7974 8.9   Albumin (g/dL)  Date Value  90/76/7974 2.2 (L)  11/19/2023 4.3   Phosphorus (mg/dL)  Date Value  90/76/7974 8.0 (H)   Sodium (mmol/L)  Date Value  12/30/2023 141  08/16/2023 140     Assessment: 53 y.o. Caucasian male with medical history significant for asthma, coronary artery disease, GERD, hypertension and dyslipidemia, who presented to the emergency room with acute onset of recurrent nausea and vomiting with associated diarrhea which have been going on since Wednesday.  Elevated phos, nephro following.   Goal of Therapy:  Electrolytes WNL   Plan:  No replacement needed  F/u with AM labs.   Cathaleen GORMAN Blanch ,PharmD Clinical Pharmacist 12/30/2023 7:38 AM

## 2023-12-30 NOTE — TOC CM/SW Note (Signed)
 Transition of Care [facility]) - Inpatient Brief Assessment   Patient Details  Name: Jesse Nelson MRN: [id number] Date of Birth: 11-May-1970  Transition of Care [facility]) CM/SW Contact:    [name], LCSW Phone Number: 12/30/2023, 3:20 PM   Clinical Narrative: Patient has orders to discharge home today. Chart reviewed. No TOC needs identified. CSW signing off.  Transition of Care Asessment: Insurance and Status: Insurance coverage has been reviewed Patient has primary care physician: Yes Home environment has been reviewed: Single family home Prior level of function:: Not documented Prior/Current Home Services: No current home services Social Drivers of Health Review: SDOH reviewed no interventions necessary Readmission risk has been reviewed: Yes Transition of care needs: no transition of care needs at this time

## 2023-12-31 ENCOUNTER — Other Ambulatory Visit: Payer: Self-pay | Admitting: Cardiovascular Disease

## 2023-12-31 LAB — PROTEIN ELECTROPHORESIS, SERUM
A/G Ratio: 0.8 (ref 0.7–1.7)
Albumin ELP: 2.7 g/dL — ABNORMAL LOW (ref 2.9–4.4)
Alpha-1-Globulin: 0.3 g/dL (ref 0.0–0.4)
Alpha-2-Globulin: 1.3 g/dL — ABNORMAL HIGH (ref 0.4–1.0)
Beta Globulin: 0.9 g/dL (ref 0.7–1.3)
Gamma Globulin: 0.6 g/dL (ref 0.4–1.8)
Globulin, Total: 3.2 g/dL (ref 2.2–3.9)
Total Protein ELP: 5.9 g/dL — ABNORMAL LOW (ref 6.0–8.5)

## 2023-12-31 LAB — PROTEIN ELECTRO, RANDOM URINE
Albumin ELP, Urine: 8.8 %
Alpha-1-Globulin, U: 5.6 %
Alpha-2-Globulin, U: 31.7 %
Beta Globulin, U: 23.6 %
Gamma Globulin, U: 30.2 %
M Component, Ur: 1.5 % — ABNORMAL HIGH
Total Protein, Urine: 12.6 mg/dL

## 2023-12-31 NOTE — Discharge Summary (Signed)
 Physician Discharge Summary   Patient: Jesse Nelson MRN: [id number] DOB: [birth year]  Admit date:     12/27/2023  Discharge date: 12/30/2023  Discharge Physician: Cresencio Fairly   PCP: [name], MD   Recommendations at discharge:    F/up with outpt providers as requested  Discharge Diagnoses: Principal Problem:   AKI (acute kidney injury) Active Problems:   GERD without esophagitis   Essential hypertension   Dyslipidemia   Asthma, chronic   Coronary artery disease involving native coronary artery of native heart without angina pectoris  Hospital Course: Assessment and Plan:  53 y.o. Caucasian male with medical history significant for asthma, coronary artery disease, GERD, hypertension and dyslipidemia, who presented to the emergency room with acute onset of recurrent nausea and vomiting with associated diarrhea which have been going on since Wednesday.  Quit drinking alcohol about 6 days ago prior to presentation due to nausea Admitted for AKI, Nephrology following   9/24: C.Diff neg, imodium  prn added for diarrhea. Serology w/up ordered by Nephro   Acute kidney injury with mild proteinuria likely secondary to severe dehydration.  Normal renal function noted in May of this year.  CT abdomen pelvis negative for renal obstruction.  No recent IV contrast exposure. Creatinine has improved greatly. Serologies pending: ANA, ANCA, C3 and C4 complements, kappa lambda ratios, SPEP and UPEP. Nephro will f/up on the results in the office f/up  Lab Results  Component Value Date   CREATININE 2.00 (H) 12/30/2023   CREATININE 3.10 (H) 12/30/2023   CREATININE 9.56 (H) 12/29/2023    - Improving with hydration. Outpt f/up with Nephro   Hypokalemia Hypocalcemia Repleted   Normocytic anemia - stable H & H   Alcohol use - Drinks 6-8 beer daily, last drink 09/17 - no s/s alcohol withdrawal    Asthma, not in exacerbation - Singulair  and as needed albuterol .   Dyslipidemia -  hold off statin therapy given elevated CK. Resume at DC   Essential hypertension Hypotension - Held amlodipine , Lisinopril , hydrochlorothiazide , Metoprolol  due to borderline low BP - hold ibuprofen, asa and lisinopril -hydrochlorothiazide  at DC   GERD without esophagitis - continue PPI therapy and H2 blockers   Diarrhea On & off since last 7-10 days.  Evonne c. Diff neg. Prn imodium        Consultants: Nephro Disposition: Home Diet recommendation:  Discharge Diet Orders (From admission, onward)     Start     Ordered   12/30/23 0000  Diet - low sodium heart healthy        12/30/23 1500           Renal diet DISCHARGE MEDICATION: Allergies as of 12/30/2023   No Known Allergies      Medication List     STOP taking these medications    aspirin  81 MG chewable tablet   ibuprofen 800 MG tablet Commonly known as: ADVIL   lisinopril -hydrochlorothiazide  20-12.5 MG tablet Commonly known as: ZESTORETIC        TAKE these medications    albuterol  108 (90 Base) MCG/ACT inhaler Commonly known as: VENTOLIN  HFA INHALE 1 PUFF EVERY 6 HOURS AS NEEDED   amLODipine  5 MG tablet Commonly known as: NORVASC  TAKE 1 TABLET(5 MG) BY MOUTH EVERY MORNING   clopidogrel  75 MG tablet Commonly known as: PLAVIX  Take 75 mg by mouth daily.   famotidine  20 MG tablet Commonly known as: PEPCID  Take 1 tablet (20 mg total) by mouth daily. What changed: when to take this   fluticasone 50 MCG/ACT  nasal spray Commonly known as: FLONASE Place 2 sprays into both nostrils daily. What changed:  when to take this reasons to take this   metoprolol  200 MG 24 hr tablet Commonly known as: Toprol  XL Take 1 tablet (200 mg total) by mouth daily.   montelukast  10 MG tablet Commonly known as: SINGULAIR  TAKE 1 TABLET BY MOUTH EVERY MORNING   nitroGLYCERIN  0.4 MG SL tablet Commonly known as: Nitrostat  Place 1 tablet (0.4 mg total) under the tongue every 5 (five) minutes as needed for chest  pain.   oxyCODONE -acetaminophen  10-325 MG tablet Commonly known as: PERCOCET Take 1 tablet by mouth every 4 (four) hours as needed for pain.   pantoprazole  40 MG tablet Commonly known as: Protonix  Take 1 tablet (40 mg total) by mouth daily.   rosuvastatin  10 MG tablet Commonly known as: Crestor  Take 1 tablet (10 mg total) by mouth daily.   Vitamin B 12 500 MCG Tabs Take 1,000 mcg by mouth daily in the afternoon.        Follow-up Information     [name], MD. Schedule an appointment as soon as possible for a visit in 1 week(s).   Specialty: Internal Medicine Why: [facility] Discharge F/UP Contact information: [address] [place] [address] [phone]         Marcelino Gales, MD. Schedule an appointment as soon as possible for a visit in 2 week(s).   Specialty: Nephrology Why: [facility] Discharge F/UP Contact information: [address] [place] [address] [phone]                Discharge Exam: Filed Weights   12/27/23 1648 12/28/23 1300  Weight: 61.7 kg 68.3 kg   GENERAL:  comfortable, no acute distress NECK:  Supple, no jugular venous distention. No thyroid enlargement, no tenderness.  LUNGS: Normal breath sounds bilaterally, no wheezing  CARDIOVASCULAR: Regular rate and rhythm, S1, S2 normal. No murmurs, rubs, or gallops.  ABDOMEN: Soft, nondistended, nontender. Bowel sounds present EXTREMITIES: No pedal edema, cyanosis, or clubbing NEUROLOGIC: AO x 3, no gross focal deficits  Condition at discharge: fair  The results of significant diagnostics from this hospitalization (including imaging, microbiology, ancillary and laboratory) are listed below for reference.   Imaging Studies: CT Renal Stone Study Result Date: 12/27/2023 EXAM: CT ABDOMEN AND PELVIS WITHOUT CONTRAST 12/27/2023 08:03:00 PM TECHNIQUE: CT of the abdomen and pelvis was performed without the administration of intravenous contrast. Multiplanar  reformatted images are provided for review. Automated exposure control, iterative reconstruction, and/or weight-based adjustment of the mA/kV was utilized to reduce the radiation dose to as low as reasonably achievable. COMPARISON: None available. CLINICAL HISTORY: Abdominal/flank pain, stone suspected. N/V/D for the last five days. Pt sts that he has not urinated in the last three days. Pt sts before he stopped urinating his urine was dark like tea. Pt abd is distended. Per EMS pt was hypotensive on their arrival. EMS gave 1L of LR enroute to ED via; 20g in the RFA with 4mg  of zofran . FINDINGS: LOWER CHEST: Tiny hiatal hernia. LIVER: Mild hepatic steatosis. GALLBLADDER AND BILE DUCTS: Gallbladder is unremarkable. No biliary ductal dilatation. SPLEEN: No acute abnormality. PANCREAS: No acute abnormality. ADRENAL GLANDS: No acute abnormality. KIDNEYS, URETERS AND BLADDER: 11 mm simple right lower pole renal cyst (image 37), benign (Bosniak 1). No follow up is recommended. No stones in the kidneys or ureters. No hydronephrosis. No perinephric or periureteral stranding. Urinary bladder is unremarkable. GI AND BOWEL: Normal  appendix (image 55). There is no bowel obstruction. PERITONEUM AND RETROPERITONEUM: No ascites. No free air. VASCULATURE: Atherosclerotic calcifications of the abdominal aorta and branch vessels. LYMPH NODES: No lymphadenopathy. REPRODUCTIVE ORGANS: Prostate is unremarkable. BONES AND SOFT TISSUES: Thoracic spine stimulator. Tiny fat-containing left inguinal hernia. No acute osseous abnormality. IMPRESSION: 1. No acute findings in the abdomen or pelvis. 2. Mild hepatic steatosis. Electronically signed by: Pinkie Pebbles MD 12/27/2023 08:22 PM EDT RP Workstation: [id number]   DG Chest 2 View Result Date: 12/27/2023 EXAM: 2 VIEW(S) XRAY OF THE CHEST 12/27/2023 07:42:00 PM COMPARISON: 11/23/2007 CLINICAL HISTORY: Cough. The reason for exam is stated as cough. Patient also with N/V/D x 5 days,  abdominal pain, and inability to urinate x 3 days. Hx of CAD, HTN, and current smoker of 1 pack a day. FINDINGS: LUNGS AND PLEURA: No focal pulmonary opacity. No pulmonary edema. No pleural effusion. No pneumothorax. HEART AND MEDIASTINUM: No acute abnormality of the cardiac and mediastinal silhouettes. BONES AND SOFT TISSUES: Osteolysis of the left distal clavicle. No acute osseous abnormality. IMPRESSION: 1. No acute cardiopulmonary process. Electronically signed by: Pinkie Pebbles MD 12/27/2023 08:16 PM EDT RP Workstation: [id number]    Microbiology: Results for orders placed or performed during the hospital encounter of 12/27/23  Resp panel by RT-PCR (RSV, Flu A&B, Covid) Anterior Nasal Swab     Status: None   Collection Time: 12/27/23  5:27 PM   Specimen: Anterior Nasal Swab  Result Value Ref Range Status   SARS Coronavirus 2 by RT PCR NEGATIVE NEGATIVE Final    Comment: (NOTE) SARS-CoV-2 target nucleic acids are NOT DETECTED.  The SARS-CoV-2 RNA is generally detectable in upper respiratory specimens during the acute phase of infection. The lowest concentration of SARS-CoV-2 viral copies this assay can detect is 138 copies/mL. A negative result does not preclude SARS-Cov-2 infection and should not be used as the sole basis for treatment or other patient management decisions. A negative result may occur with  improper specimen collection/handling, submission of specimen other than nasopharyngeal swab, presence of viral mutation(s) within the areas targeted by this assay, and inadequate number of viral copies(<138 copies/mL). A negative result must be combined with clinical observations, patient history, and epidemiological information. The expected result is Negative.  Fact Sheet for Patients:  BloggerCourse.com  Fact Sheet for Healthcare Providers:  SeriousBroker.it  This test is no t yet approved or cleared by the [place]   FDA and  has been authorized for detection and/or diagnosis of SARS-CoV-2 by FDA under an Emergency Use Authorization (EUA). This EUA will remain  in effect (meaning this test can be used) for the duration of the COVID-19 declaration under Section 564(b)(1) of the Act, 21 U.S.C.section 360bbb-3(b)(1), unless the authorization is terminated  or revoked sooner.       Influenza A by PCR NEGATIVE NEGATIVE Final   Influenza B by PCR NEGATIVE NEGATIVE Final    Comment: (NOTE) The Xpert Xpress SARS-CoV-2/FLU/RSV plus assay is intended as an aid in the diagnosis of influenza from Nasopharyngeal swab specimens and should not be used as a sole basis for treatment. Nasal washings and aspirates are unacceptable for Xpert Xpress SARS-CoV-2/FLU/RSV testing.  Fact Sheet for Patients: BloggerCourse.com  Fact Sheet for Healthcare Providers: SeriousBroker.it  This test is not yet approved or cleared by the [place]  FDA and has been authorized for detection and/or diagnosis of SARS-CoV-2 by FDA under an Emergency Use Authorization (EUA). This EUA will remain in effect (meaning this test can be used)  for the duration of the COVID-19 declaration under Section 564(b)(1) of the Act, 21 U.S.C. section 360bbb-3(b)(1), unless the authorization is terminated or revoked.     Resp Syncytial Virus by PCR NEGATIVE NEGATIVE Final    Comment: (NOTE) Fact Sheet for Patients: BloggerCourse.com  Fact Sheet for Healthcare Providers: SeriousBroker.it  This test is not yet approved or cleared by the [place]  FDA and has been authorized for detection and/or diagnosis of SARS-CoV-2 by FDA under an Emergency Use Authorization (EUA). This EUA will remain in effect (meaning this test can be used) for the duration of the COVID-19 declaration under Section 564(b)(1) of the Act, 21 U.S.C. section  360bbb-3(b)(1), unless the authorization is terminated or revoked.  Performed at [facility], [address] [place], [place] [address]   C Difficile Quick Screen w PCR reflex     Status: None   Collection Time: 12/29/23  2:13 PM   Specimen: STOOL  Result Value Ref Range Status   C Diff antigen NEGATIVE NEGATIVE Final   C Diff toxin NEGATIVE NEGATIVE Final   C Diff interpretation No C. difficile detected.  Final    Comment: Performed at [facility], [address] [place], [place] [address]    Labs: CBC: Recent Labs  Lab 12/27/23 1649 12/28/23 0253 12/29/23 0324 12/30/23 0328  WBC 7.4 5.9 6.6 6.9  HGB 13.1 10.3* 11.6* 11.6*  HCT 35.9* 29.0* 32.6* 32.7*  MCV 87.1 88.7 87.6 86.7  PLT 257 221 289 336   Basic Metabolic Panel: Recent Labs  Lab 12/27/23 1649 12/28/23 0253 12/28/23 2101 12/29/23 0324 12/30/23 0328 12/30/23 1333  NA 130* 136  --  135 141  --   K 3.5 3.0* 3.7 3.4* 4.2  --   CL 92* 107  --  107 110  --   CO2 11* 12*  --  12* 20*  --   GLUCOSE 122* 73  --  95 92  --   BUN 97* 83*  --  91* 64*  --   CREATININE 14.21* 11.88*  --  9.56* 3.10* 2.00*  CALCIUM  7.6* 5.4*  --  7.3* 8.9  --   MG  --  1.7  --   --   --   --   PHOS  --  8.0*  --   --   --   --    Liver Function Tests: Recent Labs  Lab 12/27/23 1649 12/28/23 0253  AST 14*  --   ALT 7  --   ALKPHOS 47  --   BILITOT 0.7  --   PROT 7.1  --   ALBUMIN 3.1* 2.2*   CBG: No results for input(s): GLUCAP in the last 168 hours.  Discharge time spent: greater than 30 minutes.  Signed: Cresencio Fairly, MD [facility] 12/31/2023

## 2024-01-01 LAB — ANA W/REFLEX IF POSITIVE: Anti Nuclear Antibody (ANA): NEGATIVE

## 2024-01-11 ENCOUNTER — Ambulatory Visit (INDEPENDENT_AMBULATORY_CARE_PROVIDER_SITE_OTHER): Admitting: Internal Medicine

## 2024-01-11 VITALS — BP 124/78 | HR 86 | Temp 97.9°F | Ht 68.0 in | Wt 144.2 lb

## 2024-01-11 DIAGNOSIS — I10 Essential (primary) hypertension: Secondary | ICD-10-CM

## 2024-01-11 DIAGNOSIS — N179 Acute kidney failure, unspecified: Secondary | ICD-10-CM | POA: Diagnosis not present

## 2024-01-11 DIAGNOSIS — R7303 Prediabetes: Secondary | ICD-10-CM

## 2024-01-11 NOTE — Progress Notes (Signed)
 Established Patient Office Visit  Subjective:  Patient ID: Jesse Nelson, male    DOB: Aug 11, 1970  Age: 53 y.o. MRN: [id number]  Chief Complaint  Patient presents with   Follow-up    Hospital follow up    Hospital f/u for AKI due to dehydration, rehydrated with improvement in renal function. BP has remained well controlled since discharge while antihypertensives were held.    No other concerns at this time.   Past Medical History:  Diagnosis Date   Asthma    Coronary artery disease    Dysrhythmia    GERD (gastroesophageal reflux disease)    Hyperlipidemia    Hypertension     Past Surgical History:  Procedure Laterality Date   COLONOSCOPY N/A 09/09/2023   Procedure: COLONOSCOPY;  Surgeon: Onita Elspeth Sharper, DO;  Location: [facility] ENDOSCOPY;  Service: Gastroenterology;  Laterality: N/A;  Patient is on Plavix    ESOPHAGOGASTRODUODENOSCOPY N/A 09/09/2023   Procedure: EGD (ESOPHAGOGASTRODUODENOSCOPY);  Surgeon: Onita Elspeth Sharper, DO;  Location: [facility] ENDOSCOPY;  Service: Gastroenterology;  Laterality: N/A;   shoulder spur removed Left     Social History   Socioeconomic History   Marital status: Married    Spouse name: Not on file   Number of children: Not on file   Years of education: Not on file   Highest education level: Not on file  Occupational History   Not on file  Tobacco Use   Smoking status: Every Day    Current packs/day: 1.00    Average packs/day: 1 pack/day for 26.0 years (26.0 ttl pk-yrs)    Types: Cigarettes   Smokeless tobacco: Never  Vaping Use   Vaping status: Never Used  Substance and Sexual Activity   Alcohol use: Not on file    Comment: 3 in the afternoons   Drug use: Not Currently   Sexual activity: Not on file  Other Topics Concern   Not on file  Social History Narrative   Not on file   Social Drivers of Health   Financial Resource Strain: Medium Risk (08/11/2023)   Received from [facility]   Overall Financial  Resource Strain (CARDIA)    Difficulty of Paying Living Expenses: Somewhat hard  Food Insecurity: No Food Insecurity (12/28/2023)   Hunger Vital Sign    Worried About Running Out of Food in the Last Year: Never true    Ran Out of Food in the Last Year: Never true  Transportation Needs: No Transportation Needs (12/28/2023)   PRAPARE - Administrator, Civil Service (Medical): No    Lack of Transportation (Non-Medical): No  Physical Activity: Not on file  Stress: Not on file  Social Connections: Not on file  Intimate Partner Violence: Not At Risk (12/28/2023)   Humiliation, Afraid, Rape, and Kick questionnaire    Fear of Current or Ex-Partner: No    Emotionally Abused: No    Physically Abused: No    Sexually Abused: No    No family history on file.  No Known Allergies  Outpatient Medications Prior to Visit  Medication Sig   albuterol  (VENTOLIN  HFA) 108 (90 Base) MCG/ACT inhaler INHALE 1 PUFF EVERY 6 HOURS AS NEEDED   amLODipine  (NORVASC ) 5 MG tablet TAKE 1 TABLET(5 MG) BY MOUTH EVERY MORNING   cholecalciferol (VITAMIN D3) 25 MCG (1000 UNIT) tablet Take 1,000 Units by mouth daily.   clopidogrel  (PLAVIX ) 75 MG tablet Take 75 mg by mouth daily.   Cyanocobalamin  (VITAMIN B 12) 500 MCG TABS Take  1,000 mcg by mouth daily in the afternoon.   famotidine  (PEPCID ) 20 MG tablet Take 1 tablet (20 mg total) by mouth daily.   fluticasone (FLONASE) 50 MCG/ACT nasal spray Place 2 sprays into both nostrils daily. (Patient taking differently: Place 2 sprays into both nostrils as needed for allergies.)   lisinopril -hydrochlorothiazide  (ZESTORETIC ) 20-12.5 MG tablet TAKE 2 TABLETS BY MOUTH DAILY   metoprolol  (TOPROL  XL) 200 MG 24 hr tablet Take 1 tablet (200 mg total) by mouth daily.   montelukast  (SINGULAIR ) 10 MG tablet TAKE 1 TABLET BY MOUTH EVERY MORNING   nitroGLYCERIN  (NITROSTAT ) 0.4 MG SL tablet Place 1 tablet (0.4 mg total) under the tongue every 5 (five) minutes as needed for chest  pain.   oxyCODONE -acetaminophen  (PERCOCET) 10-325 MG tablet Take 1 tablet by mouth every 4 (four) hours as needed for pain.   pantoprazole  (PROTONIX ) 40 MG tablet Take 1 tablet (40 mg total) by mouth daily.   rosuvastatin  (CRESTOR ) 10 MG tablet Take 1 tablet (10 mg total) by mouth daily.   No facility-administered medications prior to visit.    Review of Systems  Constitutional:  Positive for weight loss.  HENT: Negative.    Eyes: Negative.   Respiratory: Negative.    Cardiovascular: Negative.   Gastrointestinal: Negative.   Genitourinary: Negative.   Musculoskeletal:  Positive for back pain and joint pain.       Chronic pain  Skin: Negative.   Neurological: Negative.  Negative for dizziness.  Endo/Heme/Allergies: Negative.   Psychiatric/Behavioral: Negative.         Objective:   BP 124/78   Pulse 86   Temp 97.9 F (36.6 C)   Ht 5' 8 (1.727 m)   Wt 144 lb 3.2 oz (65.4 kg)   SpO2 97%   BMI 21.93 kg/m   Vitals:   01/11/24 1105  BP: 124/78  Pulse: 86  Temp: 97.9 F (36.6 C)  Height: 5' 8 (1.727 m)  Weight: 144 lb 3.2 oz (65.4 kg)  SpO2: 97%  BMI (Calculated): 21.93    Physical Exam Vitals and nursing note reviewed.  Constitutional:      Appearance: Normal appearance.     Comments: Normal skin turgor and moist oral mucosa  HENT:     Head: Normocephalic.     Left Ear: There is no impacted cerumen.     Nose: Nose normal.     Mouth/Throat:     Mouth: Mucous membranes are moist.     Pharynx: No posterior oropharyngeal erythema.  Eyes:     Extraocular Movements: Extraocular movements intact.     Pupils: Pupils are equal, round, and reactive to light.  Cardiovascular:     Rate and Rhythm: Regular rhythm.     Chest Wall: PMI is not displaced.     Pulses: Normal pulses.     Heart sounds: Normal heart sounds. No murmur heard. Pulmonary:     Effort: Pulmonary effort is normal.     Breath sounds: Normal air entry. No rhonchi or rales.  Abdominal:      General: Abdomen is flat. Bowel sounds are normal. There is no distension.     Palpations: Abdomen is soft. There is no hepatomegaly, splenomegaly or mass.     Tenderness: There is no abdominal tenderness.  Musculoskeletal:        General: Normal range of motion.     Cervical back: Normal range of motion and neck supple.     Right lower leg: No edema.  Left lower leg: No edema.  Skin:    General: Skin is warm and dry.  Neurological:     General: No focal deficit present.     Mental Status: He is alert and oriented to person, place, and time.     Cranial Nerves: No cranial nerve deficit.     Motor: No weakness.  Psychiatric:        Mood and Affect: Mood normal.        Behavior: Behavior normal.      No results found for any visits on 01/11/24.      Assessment & Plan:  As per problem list  Problem List Items Addressed This Visit       Cardiovascular and Mediastinum   Primary hypertension     Genitourinary   AKI (acute kidney injury) - Primary     Other   Prediabetes    No follow-ups on file.   Total time spent: 30 minutes  Sherrill Cinderella Perry, MD  01/11/2024   This document may have been prepared by [facility] Voice Recognition software and as such may include unintentional dictation errors.

## 2024-01-27 ENCOUNTER — Telehealth: Payer: Self-pay | Admitting: *Deleted

## 2024-01-27 ENCOUNTER — Other Ambulatory Visit: Payer: Self-pay | Admitting: *Deleted

## 2024-01-27 DIAGNOSIS — Z122 Encounter for screening for malignant neoplasm of respiratory organs: Secondary | ICD-10-CM

## 2024-01-27 DIAGNOSIS — F17210 Nicotine dependence, cigarettes, uncomplicated: Secondary | ICD-10-CM

## 2024-01-27 DIAGNOSIS — Z87891 Personal history of nicotine dependence: Secondary | ICD-10-CM

## 2024-01-27 NOTE — Telephone Encounter (Signed)
 Lung Cancer Screening Narrative/Criteria Questionnaire (Cigarette Smokers Only- No Cigars/Pipes/vapes)   Jesse Nelson   SDMV:02/14/24 10:30- Natalie                                           [birth year]              LDCT: 02/15/24 11:00- [facility]    53 y.o.   Phone: [phone]  Lung Screening Narrative (confirm age 22-77 yrs Medicare / 50-80 yrs Private pay insurance)   Insurance information:MCR   Referring Provider:Shah   This screening involves an initial phone call with a team member from our program. It is called a shared decision making visit. The initial meeting is required by insurance and Medicare to make sure you understand the program. This appointment takes about 15-20 minutes to complete. The CT scan will completed at a separate date/time. This scan takes about 5-10 minutes to complete and you may eat and drink before and after the scan.  Criteria questions for Lung Cancer Screening:   Are you a current or former smoker? Current Age began smoking: 79   If you are a former smoker, what year did you quit smoking ? (within 15 yrs)   To calculate your smoking history, I need an accurate estimate of how many packs of cigarettes you smoked per day and for how many years. (Not just the number of PPD you are now smoking)   Years smoking 37 x Packs per day 1/2- 2 = Pack years 46   (at least 20 pack yrs)   (Make sure they understand that we need to know how much they have smoked in the past, not just the number of PPD they are smoking now)  Do you have a personal history of cancer?  No    Do you have a family history of cancer? Yes  (cancer type and and relative) Lung and colon but not sure who  Are you coughing up blood?  No  Have you had unexplained weight loss of 15 lbs or more in the last 6 months? No  It looks like you meet all criteria.     Additional information: N/A

## 2024-02-14 ENCOUNTER — Ambulatory Visit: Admitting: *Deleted

## 2024-02-14 ENCOUNTER — Encounter: Payer: Self-pay | Admitting: *Deleted

## 2024-02-14 DIAGNOSIS — F17210 Nicotine dependence, cigarettes, uncomplicated: Secondary | ICD-10-CM | POA: Diagnosis not present

## 2024-02-14 NOTE — Patient Instructions (Signed)

## 2024-02-14 NOTE — Progress Notes (Signed)
 Virtual Visit via Telephone Note  I connected with Jesse Nelson on 02/14/24 at 10:30 AM EST by telephone and verified that I am speaking with the correct person using two identifiers.  Location: Patient: at home Provider: [address], [place], [place], [address]    I discussed the limitations, risks, security and privacy concerns of performing an evaluation and management service by telephone and the availability of in person appointments. I also discussed with the patient that there may be a patient responsible charge related to this service. The patient expressed understanding and agreed to proceed.   Shared Decision Making Visit Lung Cancer Screening Program [phone])   Eligibility: Age 18 y.o. Pack Years Smoking History Calculation 46 (# packs/per year x # years smoked) Recent History of coughing up blood  no Unexplained weight loss? no ( >Than 15 pounds within the last 6 months ) Prior History Lung / other cancer no (Diagnosis within the last 5 years already requiring surveillance chest CT Scans). Smoking Status Current Smoker Former Smokers: Years since quit: n/a  Quit Date: n/a  Visit Components: Discussion included one or more decision making aids. yes Discussion included risk/benefits of screening. yes Discussion included potential follow up diagnostic testing for abnormal scans. yes Discussion included meaning and risk of over diagnosis. yes Discussion included meaning and risk of False Positives. yes Discussion included meaning of total radiation exposure. yes  Counseling Included: Importance of adherence to annual lung cancer LDCT screening. yes Impact of comorbidities on ability to participate in the program. yes Ability and willingness to under diagnostic treatment. yes  Smoking Cessation Counseling: Current Smokers:  Discussed importance of smoking cessation. yes Information about tobacco cessation classes and interventions provided to patient.  yes Patient provided with ticket for LDCT Scan. no Symptomatic Patient. no   Diagnosis Code: Tobacco Use Z72.0 Asymptomatic Patient yes  Smoking/Tobacco Cessation Counseling Jesse Nelson is a current user of tobacco or nicotine products. He is not ready to quit at this time. Counseling provided today addressed the risks of continued use and the benefits of cessation. Discussed tobacco/nicotine use history, readiness to quit, and evidence-based treatment options including behavioral strategies, support resources, and pharmacologic therapies. Provided encouragement and educational materials on steps and resources to quit smoking. Patient questions were addressed, and follow-up recommended for continued support. Total time spent on counseling: 4 minutes.   Former Smokers:  Discussed the importance of maintaining cigarette abstinence. yes Diagnosis Code: Personal History of Nicotine Dependence. S12.108 Information about tobacco cessation classes and interventions provided to patient. Yes Patient provided with ticket for LDCT Scan. no Written Order for Lung Cancer Screening with LDCT placed in Epic. Yes (CT Chest Lung Cancer Screening Low Dose W/O CM) [id number] Z12.2-Screening of respiratory organs Z87.891-Personal history of nicotine dependence   Laneta Speaks, RN

## 2024-02-15 ENCOUNTER — Ambulatory Visit
Admission: RE | Admit: 2024-02-15 | Discharge: 2024-02-15 | Disposition: A | Discharge status: Home / Self Care | Source: Ambulatory Visit | Attending: Acute Care | Admitting: Acute Care

## 2024-02-15 ENCOUNTER — Other Ambulatory Visit: Payer: Self-pay | Admitting: Internal Medicine

## 2024-02-15 DIAGNOSIS — E782 Mixed hyperlipidemia: Secondary | ICD-10-CM

## 2024-02-15 DIAGNOSIS — F17210 Nicotine dependence, cigarettes, uncomplicated: Secondary | ICD-10-CM | POA: Insufficient documentation

## 2024-02-15 DIAGNOSIS — Z87891 Personal history of nicotine dependence: Secondary | ICD-10-CM | POA: Diagnosis present

## 2024-02-15 DIAGNOSIS — Z122 Encounter for screening for malignant neoplasm of respiratory organs: Secondary | ICD-10-CM | POA: Insufficient documentation

## 2024-02-17 ENCOUNTER — Other Ambulatory Visit: Payer: Self-pay | Admitting: Cardiology

## 2024-02-21 ENCOUNTER — Telehealth: Payer: Self-pay | Admitting: *Deleted

## 2024-02-21 ENCOUNTER — Other Ambulatory Visit

## 2024-02-21 ENCOUNTER — Telehealth: Payer: Self-pay | Admitting: Acute Care

## 2024-02-21 NOTE — Telephone Encounter (Signed)
 I have attempted to call the patient with the results of their  Low Dose CT Chest Lung cancer screening scan. There was no answer. I have left a HIPPA compliant VM requesting the patient call the office for the scan results. I included the office contact information in the message. We will await his return call. If no return call we will continue to call until patient is contacted.    Pt. Needs a PET scan and referral to thoracic surgery. Please let me know when he returns the call. Thanks

## 2024-02-21 NOTE — Telephone Encounter (Signed)
 Call report from [facility]:  IMPRESSION: 1. Lung-RADS 1-S, negative. Continue annual screening with low-dose chest CT without contrast in 12 months. 2. The S modifier above refers to potentially clinically significant non lung cancer related findings. Specifically, indeterminate circumscribed hypodense 1.7 cm anterior mediastinal mass, cannot exclude thymic neoplasm. Cardiothoracic surgical consultation and PET-CT suggested for further evaluation. 3. Three-vessel coronary atherosclerosis. 4. Aberrant right subclavian artery. 5. Aortic Atherosclerosis (ICD10-I70.0) and Emphysema (ICD10-J43.9).

## 2024-02-22 ENCOUNTER — Other Ambulatory Visit: Payer: Self-pay

## 2024-02-22 DIAGNOSIS — R918 Other nonspecific abnormal finding of lung field: Secondary | ICD-10-CM

## 2024-02-22 DIAGNOSIS — J9859 Other diseases of mediastinum, not elsewhere classified: Secondary | ICD-10-CM

## 2024-02-22 DIAGNOSIS — Z122 Encounter for screening for malignant neoplasm of respiratory organs: Secondary | ICD-10-CM

## 2024-02-22 DIAGNOSIS — Z87891 Personal history of nicotine dependence: Secondary | ICD-10-CM

## 2024-02-22 LAB — HEPATIC FUNCTION PANEL
ALT: 6 IU/L (ref 0–44)
AST: 12 IU/L (ref 0–40)
Albumin: 4.3 g/dL (ref 3.8–4.9)
Alkaline Phosphatase: 85 IU/L (ref 47–123)
Bilirubin Total: 0.3 mg/dL (ref 0.0–1.2)
Bilirubin, Direct: 0.15 mg/dL (ref 0.00–0.40)
Total Protein: 6.6 g/dL (ref 6.0–8.5)

## 2024-02-22 LAB — LIPID PANEL
Chol/HDL Ratio: 3.5 ratio (ref 0.0–5.0)
Cholesterol, Total: 118 mg/dL (ref 100–199)
HDL: 34 mg/dL — ABNORMAL LOW (ref >39)
LDL Chol Calc (NIH): 61 mg/dL (ref 0–99)
Triglycerides: 129 mg/dL (ref 0–149)
VLDL Cholesterol Cal: 23 mg/dL (ref 5–40)

## 2024-02-22 LAB — VITAMIN B12: Vitamin B-12: 1029 pg/mL (ref 232–1245)

## 2024-02-22 NOTE — Telephone Encounter (Signed)
 Jesse Nelson  I have spoken with the patient and reviewed recent Lung CT results. He is in agreement to complete a PET scan. Order has been placed to evaluate a 1.7 cm mediastinal mass. A referral to thoracic surgery has been sent. Pt is aware he will receive two separate calls to schedule PET and thoracic surgery OV. Results and plan to PCP.  Would you like me to schedule follow up in your office to review PET results if no appt has been made with thoracic when PET has resulted?  Jesse Nelson

## 2024-02-22 NOTE — Telephone Encounter (Signed)
 See provider note from 02/21/2024, provider has contacted the patient.

## 2024-02-23 ENCOUNTER — Ambulatory Visit (INDEPENDENT_AMBULATORY_CARE_PROVIDER_SITE_OTHER): Admitting: Internal Medicine

## 2024-02-23 ENCOUNTER — Other Ambulatory Visit: Payer: Self-pay | Admitting: Internal Medicine

## 2024-02-23 ENCOUNTER — Encounter: Payer: Self-pay | Admitting: Internal Medicine

## 2024-02-23 VITALS — BP 150/88 | HR 83 | Temp 97.9°F | Ht 68.0 in | Wt 142.6 lb

## 2024-02-23 DIAGNOSIS — Z0001 Encounter for general adult medical examination with abnormal findings: Secondary | ICD-10-CM

## 2024-02-23 DIAGNOSIS — R7303 Prediabetes: Secondary | ICD-10-CM

## 2024-02-23 DIAGNOSIS — E782 Mixed hyperlipidemia: Secondary | ICD-10-CM | POA: Diagnosis not present

## 2024-02-23 DIAGNOSIS — Z1331 Encounter for screening for depression: Secondary | ICD-10-CM

## 2024-02-23 DIAGNOSIS — I10 Essential (primary) hypertension: Secondary | ICD-10-CM

## 2024-02-23 DIAGNOSIS — J301 Allergic rhinitis due to pollen: Secondary | ICD-10-CM

## 2024-02-23 MED ORDER — FLUTICASONE PROPIONATE 50 MCG/ACT NA SUSP: 2.0000 | NASAL | 2 refills | Status: AC | PRN

## 2024-02-23 MED ORDER — OLMESARTAN MEDOXOMIL-HCTZ 40-12.5 MG PO TABS: 1.0000 | ORAL_TABLET | Freq: Every day | ORAL | 0 refills | Status: DC

## 2024-02-23 NOTE — Progress Notes (Signed)
 Established Patient Office Visit  Subjective:  Patient ID: Jesse Nelson, male    DOB: [birth year]  Age: 53 y.o. MRN: [id number]  Chief Complaint  Patient presents with   Annual Exam    CPE, 3 month follow up, discuss lab results    Here for CPE, lab review and medication refills. No new complaints, here for lab review and medication refills. LDL and TC well controlled on lab review. Triglycerides also satisfactory with normal Vit B12 level and LFTs. BP elevated but had not resumed his antihypertensives since he was discharged from the hospital.       No other concerns at this time.   Past Medical History:  Diagnosis Date   Asthma    Coronary artery disease    Dysrhythmia    GERD (gastroesophageal reflux disease)    Hyperlipidemia    Hypertension     Past Surgical History:  Procedure Laterality Date   COLONOSCOPY N/A 09/09/2023   Procedure: COLONOSCOPY;  Surgeon: Onita Elspeth Sharper, DO;  Location: [facility] ENDOSCOPY;  Service: Gastroenterology;  Laterality: N/A;  Patient is on Plavix    ESOPHAGOGASTRODUODENOSCOPY N/A 09/09/2023   Procedure: EGD (ESOPHAGOGASTRODUODENOSCOPY);  Surgeon: Onita Elspeth Sharper, DO;  Location: [facility] ENDOSCOPY;  Service: Gastroenterology;  Laterality: N/A;   shoulder spur removed Left     Social History   Socioeconomic History   Marital status: Married    Spouse name: Not on file   Number of children: Not on file   Years of education: Not on file   Highest education level: Not on file  Occupational History   Not on file  Tobacco Use   Smoking status: Every Day    Current packs/day: 1.25    Average packs/day: 1.3 packs/day for 36.9 years (46.1 ttl pk-yrs)    Types: Cigarettes    Start date: 1989   Smokeless tobacco: Never  Vaping Use   Vaping status: Never Used  Substance and Sexual Activity   Alcohol use: Not Currently   Drug use: Not Currently   Sexual activity: Not on file  Other Topics Concern   Not on file  Social History  Narrative   Not on file   Social Drivers of Health   Financial Resource Strain: Medium Risk (08/11/2023)   Received from [facility]   Overall Financial Resource Strain (CARDIA)    Difficulty of Paying Living Expenses: Somewhat hard  Food Insecurity: No Food Insecurity (12/28/2023)   Hunger Vital Sign    Worried About Running Out of Food in the Last Year: Never true    Ran Out of Food in the Last Year: Never true  Transportation Needs: No Transportation Needs (12/28/2023)   PRAPARE - Administrator, Civil Service (Medical): No    Lack of Transportation (Non-Medical): No  Physical Activity: Not on file  Stress: Not on file  Social Connections: Not on file  Intimate Partner Violence: Not At Risk (12/28/2023)   Humiliation, Afraid, Rape, and Kick questionnaire    Fear of Current or Ex-Partner: No    Emotionally Abused: No    Physically Abused: No    Sexually Abused: No    History reviewed. No pertinent family history.  No Known Allergies  Outpatient Medications Prior to Visit  Medication Sig   albuterol  (VENTOLIN  HFA) 108 (90 Base) MCG/ACT inhaler INHALE 1 PUFF EVERY 6 HOURS AS NEEDED   amLODipine  (NORVASC ) 5 MG tablet TAKE 1 TABLET(5 MG) BY MOUTH EVERY MORNING   cholecalciferol (VITAMIN D3)  25 MCG (1000 UNIT) tablet Take 1,000 Units by mouth daily.   clopidogrel  (PLAVIX ) 75 MG tablet Take 75 mg by mouth daily.   cyanocobalamin  (VITAMIN B12) 1000 MCG tablet Take 1,000 mcg by mouth daily.   famotidine  (PEPCID ) 20 MG tablet Take 1 tablet (20 mg total) by mouth daily.   metoprolol  (TOPROL  XL) 200 MG 24 hr tablet Take 1 tablet (200 mg total) by mouth daily.   montelukast  (SINGULAIR ) 10 MG tablet TAKE 1 TABLET BY MOUTH EVERY MORNING   nitroGLYCERIN  (NITROSTAT ) 0.4 MG SL tablet Place 1 tablet (0.4 mg total) under the tongue every 5 (five) minutes as needed for chest pain.   oxyCODONE -acetaminophen  (PERCOCET) 10-325 MG tablet Take 1 tablet by mouth every 4  (four) hours as needed for pain.   pantoprazole  (PROTONIX ) 40 MG tablet Take 1 tablet (40 mg total) by mouth daily.   rosuvastatin  (CRESTOR ) 10 MG tablet TAKE 1 TABLET(10 MG) BY MOUTH DAILY   [DISCONTINUED] fluticasone  (FLONASE ) 50 MCG/ACT nasal spray Place 2 sprays into both nostrils daily. (Patient taking differently: Place 2 sprays into both nostrils as needed for allergies.)   [DISCONTINUED] lisinopril -hydrochlorothiazide  (ZESTORETIC ) 20-12.5 MG tablet TAKE 2 TABLETS BY MOUTH DAILY   No facility-administered medications prior to visit.    Review of Systems  Constitutional:  Positive for weight loss (2 lbs).  HENT: Negative.    Eyes: Negative.   Respiratory: Negative.    Cardiovascular: Negative.   Gastrointestinal: Negative.   Genitourinary: Negative.   Musculoskeletal:  Positive for back pain and joint pain.       Chronic pain  Skin: Negative.   Neurological: Negative.  Negative for dizziness.  Endo/Heme/Allergies: Negative.   Psychiatric/Behavioral: Negative.         Objective:   BP (!) 150/88   Pulse 83   Temp 97.9 F (36.6 C) (Tympanic)   Ht 5' 8 (1.727 m)   Wt 142 lb 9.6 oz (64.7 kg)   SpO2 98%   BMI 21.68 kg/m   Vitals:   02/23/24 1042  BP: (!) 150/88  Pulse: 83  Temp: 97.9 F (36.6 C)  Height: 5' 8 (1.727 m)  Weight: 142 lb 9.6 oz (64.7 kg)  SpO2: 98%  TempSrc: Tympanic  BMI (Calculated): 21.69    Physical Exam Vitals and nursing note reviewed.  Constitutional:      Appearance: Normal appearance.     Comments: Normal skin turgor and moist oral mucosa  HENT:     Head: Normocephalic.     Left Ear: There is no impacted cerumen.     Nose: Nose normal.     Mouth/Throat:     Mouth: Mucous membranes are moist.     Pharynx: No posterior oropharyngeal erythema.  Eyes:     Extraocular Movements: Extraocular movements intact.     Pupils: Pupils are equal, round, and reactive to light.  Cardiovascular:     Rate and Rhythm: Regular rhythm.      Chest Wall: PMI is not displaced.     Pulses: Normal pulses.     Heart sounds: Normal heart sounds. No murmur heard. Pulmonary:     Effort: Pulmonary effort is normal.     Breath sounds: Normal air entry. No rhonchi or rales.  Abdominal:     General: Abdomen is flat. Bowel sounds are normal. There is no distension.     Palpations: Abdomen is soft. There is no hepatomegaly, splenomegaly or mass.     Tenderness: There is no abdominal tenderness.  Musculoskeletal:        General: Normal range of motion.     Cervical back: Normal range of motion and neck supple.     Right lower leg: No edema.     Left lower leg: No edema.  Skin:    General: Skin is warm and dry.  Neurological:     General: No focal deficit present.     Mental Status: He is alert and oriented to person, place, and time.     Cranial Nerves: No cranial nerve deficit.     Motor: No weakness.  Psychiatric:        Mood and Affect: Mood normal.        Behavior: Behavior normal.      No results found for any visits on 02/23/24.      Assessment & Plan:  Malikiah was seen today for annual exam.  Encounter for general adult medical examination with abnormal findings  Primary hypertension -     Olmesartan Medoxomil-HCTZ; Take 1 tablet by mouth daily.  Dispense: 30 tablet; Refill: 0  Mixed hyperlipidemia  Prediabetes  Seasonal allergic rhinitis due to pollen -     Fluticasone Propionate; Place 2 sprays into both nostrils as needed for allergies.  Dispense: 16 mL; Refill: 2    Problem List Items Addressed This Visit       Cardiovascular and Mediastinum   Primary hypertension   Relevant Medications   olmesartan-hydrochlorothiazide  (BENICAR HCT) 40-12.5 MG tablet     Other   Mixed hyperlipidemia   Relevant Medications   olmesartan-hydrochlorothiazide  (BENICAR HCT) 40-12.5 MG tablet   Prediabetes   Encounter for general adult medical examination with abnormal findings - Primary   Other Visit Diagnoses        Seasonal allergic rhinitis due to pollen       Relevant Medications   fluticasone (FLONASE) 50 MCG/ACT nasal spray       Return in 4 weeks (on 03/22/2024) for BP followup.   Total time spent: 30 minutes. This time includes review of previous notes and results and patient face to face interaction during today'Kentaro Alewine visit.    Sherrill Cinderella Perry, MD  02/23/2024   This document may have been prepared by [facility] Voice Recognition software and as such may include unintentional dictation errors.

## 2024-02-25 ENCOUNTER — Ambulatory Visit
Admission: RE | Admit: 2024-02-25 | Discharge: 2024-02-25 | Disposition: A | Discharge status: Home / Self Care | Source: Ambulatory Visit | Attending: Acute Care | Admitting: Acute Care

## 2024-02-25 DIAGNOSIS — J9859 Other diseases of mediastinum, not elsewhere classified: Secondary | ICD-10-CM

## 2024-02-25 DIAGNOSIS — R918 Other nonspecific abnormal finding of lung field: Secondary | ICD-10-CM | POA: Diagnosis present

## 2024-02-25 DIAGNOSIS — I700 Atherosclerosis of aorta: Secondary | ICD-10-CM | POA: Insufficient documentation

## 2024-02-25 DIAGNOSIS — J439 Emphysema, unspecified: Secondary | ICD-10-CM | POA: Insufficient documentation

## 2024-02-25 DIAGNOSIS — Z87891 Personal history of nicotine dependence: Secondary | ICD-10-CM | POA: Diagnosis present

## 2024-02-25 DIAGNOSIS — Z122 Encounter for screening for malignant neoplasm of respiratory organs: Secondary | ICD-10-CM

## 2024-02-25 LAB — GLUCOSE, CAPILLARY: Glucose-Capillary: 106 mg/dL — ABNORMAL HIGH (ref 70–99)

## 2024-02-25 MED ORDER — FLUDEOXYGLUCOSE F - 18 (FDG) INJECTION
8.0900 | Freq: Once | INTRAVENOUS | Status: AC | PRN
  Administered 2024-02-25: 8.09 via INTRAVENOUS

## 2024-02-28 ENCOUNTER — Other Ambulatory Visit: Payer: Self-pay | Admitting: Cardiovascular Disease

## 2024-02-28 ENCOUNTER — Other Ambulatory Visit: Payer: Self-pay

## 2024-02-29 ENCOUNTER — Ambulatory Visit

## 2024-02-29 MED ORDER — CLOPIDOGREL BISULFATE 75 MG PO TABS: 75.0000 mg | ORAL_TABLET | Freq: Once | ORAL | 0 refills | Status: AC

## 2024-03-14 ENCOUNTER — Ambulatory Visit
Attending: Thoracic Surgery (Cardiothoracic Vascular Surgery) | Admitting: Thoracic Surgery (Cardiothoracic Vascular Surgery)

## 2024-03-14 ENCOUNTER — Encounter: Payer: Self-pay | Admitting: Thoracic Surgery (Cardiothoracic Vascular Surgery)

## 2024-03-14 VITALS — BP 110/53 | HR 82 | Resp 20 | Ht 68.0 in | Wt 141.9 lb

## 2024-03-14 DIAGNOSIS — J9859 Other diseases of mediastinum, not elsewhere classified: Secondary | ICD-10-CM | POA: Insufficient documentation

## 2024-03-14 NOTE — Progress Notes (Signed)
 [address], [name] [address]             [phone]   PCP is [name], MD Referring Provider is Ruthell Lauraine FALCON, NP  Chief Complaint  Patient presents with   Mediastinal Mass    New patient consultation, review all studies    HPI: Mr. Jesse Nelson is sent for consultation regarding an anterior mediastinal nodule.  [name] is a 53 year old man with a past history significant for tobacco abuse, ethanol abuse (quit 3 months ago), hypertension, hyperlipidemia, asthma, COPD, coronary disease, dysrhythmia, and reflux.  He has smoked about a pack a day for 35 years.  Last month he had a CT for lung cancer screening.  There were no suspicious lung nodules but there was a 1.5 cm well-circumscribed anterior mediastinal nodule.  He then had a PET/CT which the nodule was photopenic.  He used to drink about 8 beers a day.  Quit 3 months ago.  Still smoking a pack of cigarettes daily.  Does have some shortness of breath on exertion and decreased energy.  Disabled secondary to back problems.  Past Medical History:  Diagnosis Date   Asthma    Coronary artery disease    Dysrhythmia    GERD (gastroesophageal reflux disease)    Hyperlipidemia    Hypertension     Past Surgical History:  Procedure Laterality Date   COLONOSCOPY N/A 09/09/2023   Procedure: COLONOSCOPY;  Surgeon: Onita Elspeth Sharper, DO;  Location: [facility] ENDOSCOPY;  Service: Gastroenterology;  Laterality: N/A;  Patient is on Plavix    ESOPHAGOGASTRODUODENOSCOPY N/A 09/09/2023   Procedure: EGD (ESOPHAGOGASTRODUODENOSCOPY);  Surgeon: Onita Elspeth Sharper, DO;  Location: [facility] ENDOSCOPY;  Service: Gastroenterology;  Laterality: N/A;   shoulder spur removed Left     History reviewed. No pertinent family history.  Social History Social History   Tobacco Use   Smoking status: Every Day    Current packs/day: 1.25    Average packs/day: 1.3 packs/day for 36.9 years (46.2 ttl pk-yrs)    Types:  Cigarettes    Start date: 1989   Smokeless tobacco: Never  Vaping Use   Vaping status: Never Used  Substance Use Topics   Alcohol use: Not Currently   Drug use: Not Currently    Current Outpatient Medications  Medication Sig Dispense Refill   albuterol  (VENTOLIN  HFA) 108 (90 Base) MCG/ACT inhaler INHALE 1 PUFF EVERY 6 HOURS AS NEEDED 18 g 5   amLODipine  (NORVASC ) 5 MG tablet TAKE 1 TABLET(5 MG) BY MOUTH EVERY MORNING 90 tablet 1   cholecalciferol (VITAMIN D3) 25 MCG (1000 UNIT) tablet Take 1,000 Units by mouth daily.     clopidogrel  (PLAVIX ) 75 MG tablet Take 75 mg by mouth daily.     cyanocobalamin  (VITAMIN B12) 1000 MCG tablet Take 1,000 mcg by mouth daily.     famotidine  (PEPCID ) 20 MG tablet Take 1 tablet (20 mg total) by mouth daily.     fluticasone  (FLONASE ) 50 MCG/ACT nasal spray Place 2 sprays into both nostrils as needed for allergies. 16 mL 2   metoprolol  (TOPROL  XL) 200 MG 24 hr tablet Take 1 tablet (200 mg total) by mouth daily. 90 tablet 3   montelukast  (SINGULAIR ) 10 MG tablet TAKE 1 TABLET BY MOUTH EVERY MORNING 90 tablet 1   nitroGLYCERIN  (NITROSTAT ) 0.4 MG SL tablet Place 1 tablet (0.4 mg total) under the tongue every 5 (five) minutes as needed for chest pain. 100 tablet 3  olmesartan -hydrochlorothiazide  (BENICAR  HCT) 40-12.5 MG tablet Take 1 tablet by mouth daily. 30 tablet 0   oxyCODONE -acetaminophen  (PERCOCET) 10-325 MG tablet Take 1 tablet by mouth every 4 (four) hours as needed for pain.     pantoprazole  (PROTONIX ) 40 MG tablet Take 1 tablet (40 mg total) by mouth daily. 30 tablet 11   rosuvastatin  (CRESTOR ) 10 MG tablet TAKE 1 TABLET(10 MG) BY MOUTH DAILY 90 tablet 1   No current facility-administered medications for this visit.    No Known Allergies  Review of Systems  Constitutional:  Positive for fatigue.  Respiratory:  Positive for cough, shortness of breath and wheezing.   Cardiovascular:  Negative for chest pain and leg swelling.  Gastrointestinal:   Positive for abdominal pain (Reflux).  Musculoskeletal:  Positive for arthralgias, gait problem, joint swelling and myalgias.  Skin:        Itching  Hematological:  Bruises/bleeds easily (On Plavix ).  Psychiatric/Behavioral:  The patient is nervous/anxious.     BP (!) 110/53 (BP Location: Right Arm, Patient Position: Sitting, Cuff Size: Normal)   Pulse 82   Resp 20   Ht 5' 8 (1.727 m)   Wt 141 lb 14.4 oz (64.4 kg)   SpO2 97% Comment: RA  BMI 21.58 kg/m  Physical Exam Vitals reviewed.  Constitutional:      General: He is not in acute distress.    Appearance: Normal appearance.  HENT:     Head: Normocephalic and atraumatic.  Eyes:     Extraocular Movements: Extraocular movements intact.  Neck:     Vascular: No carotid bruit.  Cardiovascular:     Rate and Rhythm: Normal rate and regular rhythm.     Heart sounds: No murmur heard. Pulmonary:     Effort: Pulmonary effort is normal. No respiratory distress.     Breath sounds: Normal breath sounds. No wheezing.  Abdominal:     General: There is no distension.     Palpations: Abdomen is soft.  Lymphadenopathy:     Cervical: No cervical adenopathy.  Skin:    General: Skin is warm and dry.  Neurological:     General: No focal deficit present.     Mental Status: He is alert and oriented to person, place, and time.     Cranial Nerves: No cranial nerve deficit.     Diagnostic Tests: NUCLEAR MEDICINE PET SKULL BASE TO THIGH   TECHNIQUE: 8.1 mCi F-18 FDG was injected intravenously. Full-ring PET imaging was performed from the skull base to thigh after the radiotracer. CT data was obtained and used for attenuation correction and anatomic localization.   Fasting blood glucose: 106 mg/dl   COMPARISON:  Lung cancer screening chest CT 02/15/2024.   FINDINGS: Mediastinal blood pool activity: SUV max 2.5   Liver activity: SUV max NA   NECK: No hypermetabolic lymph nodes in the neck.   Incidental CT findings: None.    CHEST: 1.6 cm well-defined homogeneous nodule identified in the anterior mediastinum as noted on recent chest CT. This nodule shows no FDG accumulation and is essentially photopenic/devoid of activity on PET imaging. SUV max = 0.6 which is qualitatively at background mediastinal fat levels.   No hypermetabolic mediastinal or hilar lymphadenopathy. No hypermetabolic axillary lymphadenopathy. No hypermetabolic pulmonary lesion.   Incidental CT findings: Coronary artery calcification is evident. Aberrant right subclavian artery evident. Centrilobular emphysema evident.   ABDOMEN/PELVIS: No abnormal hypermetabolic activity within the liver, pancreas, adrenal glands, or spleen. No hypermetabolic lymph nodes in the abdomen or pelvis.  5.7 x 2.3 cm fat and soft tissue density lesion is identified in the adductor muscles of the left pelvis (image 145/6 low level FDG uptake in the lesion with SUV max = 2.3.   Incidental CT findings: Mild atherosclerotic calcification noted in the nonaneurysmal abdominal aorta.   SKELETON: No focal hypermetabolic activity to suggest skeletal metastasis.   Incidental CT findings: Scattered tiny densely sclerotic foci in the bony pelvis and left femoral head show no hypermetabolism and are likely bone islands. Thoracic spinal stimulator device evident.   IMPRESSION: 1. 1.6 cm well-defined homogeneous nodule in the anterior mediastinum shows no FDG accumulation and is essentially photopenic/devoid of activity on PET imaging. Imaging features are most suggestive of a benign etiology such as a cyst. While lack of FDG avidity is reassuring, continued surveillance may be warranted. 2. 5.7 x 2.3 cm fat and soft tissue density lesion in the adductor muscles of the left pelvis with low level FDG uptake. Lesion contains more soft tissue components than expected for a lipoma and further workup is warranted. MRI of the pelvis with and without contrast  recommended to further evaluate. 3. Aortic Atherosclerosis (ICD10-I70.0) and Emphysema (ICD10-J43.9).   These results will be called to the ordering clinician or representative by the Radiologist Assistant, and communication documented in the PACS or [employer].     Electronically Signed   By: Camellia Candle M.D.   On: 03/03/2024 07:47 I personally reviewed the CT images and the PET/CT images.  There is a 1.5 to 1.6 cm round well-circumscribed density in the anterior mediastinum which is photopenic on PET.   Impression: Jesse Rodriquez is a 45 year old man with a past history significant for tobacco abuse, ethanol abuse (quit 3 months ago), hypertension, hyperlipidemia, asthma, COPD, coronary disease, dysrhythmia, and reflux.  He was recently found to have an anterior mediastinal density on a low-dose CT for lung cancer screening.  Anterior mediastinal nodule-very well-circumscribed and round.  No activity on PET.  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  No indication for surgical intervention currently.  Will plan to do a follow-up scan in a year to make sure there is no change.  Will do an MRI with and without contrast at that time to get a more detailed look at the area.  Tobacco abuse-emphasized the importance of tobacco cessation.  Pelvic adductor muscle finding on PET/CT-he has seen Dr. Dalldorf in the past for other issues.  Recommended that he see him to see if any additional workup is necessary.  I will have my office try to help facilitate that follow-up.  Emphasized the importance of following up on that to Mr. Mrs. Coover.  Plan: Recommend he follow-up with Dr. Dalldorf regarding findings in the adductor muscles in the pelvis on PET Return in 1 year with MR chest with and without intravenous contrast. Quit smoking  I spent over 30 minutes in review of records, images, and consultation with Mr. Morales today Elspeth JAYSON Millers, MD [facility] [phone]

## 2024-03-22 ENCOUNTER — Other Ambulatory Visit: Payer: Self-pay | Admitting: Internal Medicine

## 2024-03-22 ENCOUNTER — Encounter: Payer: Self-pay | Admitting: Internal Medicine

## 2024-03-22 ENCOUNTER — Ambulatory Visit: Admitting: Internal Medicine

## 2024-03-22 DIAGNOSIS — I10 Essential (primary) hypertension: Secondary | ICD-10-CM

## 2024-03-22 DIAGNOSIS — Z131 Encounter for screening for diabetes mellitus: Secondary | ICD-10-CM

## 2024-03-22 MED ORDER — OLMESARTAN MEDOXOMIL-HCTZ 40-12.5 MG PO TABS: 1.0000 | ORAL_TABLET | Freq: Every day | ORAL | 1 refills | Status: DC

## 2024-03-22 NOTE — Progress Notes (Signed)
 Established Patient Office Visit  Subjective:  Patient ID: Jesse Nelson, male    DOB: July 24, 1970  Age: 53 y.o. MRN: [id number]  Chief Complaint  Patient presents with   Follow-up    4 week follow up    BP now well controlled without any side effects.    No other concerns at this time.   Past Medical History:  Diagnosis Date   Asthma    Coronary artery disease    Dysrhythmia    GERD (gastroesophageal reflux disease)    Hyperlipidemia    Hypertension     Past Surgical History:  Procedure Laterality Date   COLONOSCOPY N/A 09/09/2023   Procedure: COLONOSCOPY;  Surgeon: Onita Elspeth Sharper, DO;  Location: [facility] ENDOSCOPY;  Service: Gastroenterology;  Laterality: N/A;  Patient is on Plavix    ESOPHAGOGASTRODUODENOSCOPY N/A 09/09/2023   Procedure: EGD (ESOPHAGOGASTRODUODENOSCOPY);  Surgeon: Onita Elspeth Sharper, DO;  Location: [facility] ENDOSCOPY;  Service: Gastroenterology;  Laterality: N/A;   shoulder spur removed Left     Social History   Socioeconomic History   Marital status: Married    Spouse name: Not on file   Number of children: Not on file   Years of education: Not on file   Highest education level: Not on file  Occupational History   Not on file  Tobacco Use   Smoking status: Every Day    Current packs/day: 1.25    Average packs/day: 1.3 packs/day for 37.0 years (46.2 ttl pk-yrs)    Types: Cigarettes    Start date: 1989   Smokeless tobacco: Never  Vaping Use   Vaping status: Never Used  Substance and Sexual Activity   Alcohol use: Not Currently   Drug use: Not Currently   Sexual activity: Not on file  Other Topics Concern   Not on file  Social History Narrative   Not on file   Social Drivers of Health   Tobacco Use: High Risk (03/22/2024)   Patient History    Smoking Tobacco Use: Every Day    Smokeless Tobacco Use: Never    Passive Exposure: Not on file  Financial Resource Strain: Medium Risk (08/11/2023)   Received from [facility]  System   Overall Financial Resource Strain (CARDIA)    Difficulty of Paying Living Expenses: Somewhat hard  Food Insecurity: No Food Insecurity (12/28/2023)   Epic    Worried About Running Out of Food in the Last Year: Never true    Ran Out of Food in the Last Year: Never true  Transportation Needs: No Transportation Needs (12/28/2023)   Epic    Lack of Transportation (Medical): No    Lack of Transportation (Non-Medical): No  Physical Activity: Not on file  Stress: Not on file  Social Connections: Not on file  Intimate Partner Violence: Not At Risk (12/28/2023)   Epic    Fear of Current or Ex-Partner: No    Emotionally Abused: No    Physically Abused: No    Sexually Abused: No  Depression (PHQ2-9): Low Risk (02/24/2024)   Depression (PHQ2-9)    PHQ-2 Score: 4  Alcohol Screen: Not on file  Housing: Low Risk (12/28/2023)   Epic    Unable to Pay for Housing in the Last Year: No    Number of Times Moved in the Last Year: 0    Homeless in the Last Year: No  Utilities: Not At Risk (12/28/2023)   Epic    Threatened with loss of utilities: No  Health Literacy: Not on  file    No family history on file.  Allergies[1]  Show/hide medication list[2]  Review of Systems  Constitutional:  Negative for weight loss.  HENT: Negative.    Eyes: Negative.   Respiratory: Negative.    Cardiovascular: Negative.   Gastrointestinal: Negative.   Genitourinary: Negative.   Musculoskeletal:  Positive for back pain and joint pain.       Chronic pain  Skin: Negative.   Neurological: Negative.  Negative for dizziness.  Endo/Heme/Allergies: Negative.   Psychiatric/Behavioral: Negative.         Objective:   BP 130/84   Pulse 77   Temp (!) 97.2 F (36.2 C) (Tympanic)   Ht 5' 8 (1.727 m)   Wt 141 lb 9.6 oz (64.2 kg)   SpO2 98%   BMI 21.53 kg/m   Vitals:   03/22/24 1125  BP: 130/84  Pulse: 77  Temp: (!) 97.2 F (36.2 C)  Height: 5' 8 (1.727 m)  Weight: 141 lb 9.6 oz (64.2 kg)   SpO2: 98%  TempSrc: Tympanic  BMI (Calculated): 21.54    Physical Exam Vitals and nursing note reviewed.  Constitutional:      Appearance: Normal appearance.     Comments: Normal skin turgor and moist oral mucosa  HENT:     Head: Normocephalic.     Left Ear: There is no impacted cerumen.     Nose: Nose normal.     Mouth/Throat:     Mouth: Mucous membranes are moist.     Pharynx: No posterior oropharyngeal erythema.  Eyes:     Extraocular Movements: Extraocular movements intact.     Pupils: Pupils are equal, round, and reactive to light.  Cardiovascular:     Rate and Rhythm: Regular rhythm.     Chest Wall: PMI is not displaced.     Pulses: Normal pulses.     Heart sounds: Normal heart sounds. No murmur heard. Pulmonary:     Effort: Pulmonary effort is normal.     Breath sounds: Normal air entry. No rhonchi or rales.  Abdominal:     General: Abdomen is flat. Bowel sounds are normal. There is no distension.     Palpations: Abdomen is soft. There is no hepatomegaly, splenomegaly or mass.     Tenderness: There is no abdominal tenderness.  Musculoskeletal:        General: Normal range of motion.     Cervical back: Normal range of motion and neck supple.     Right lower leg: No edema.     Left lower leg: No edema.  Skin:    General: Skin is warm and dry.  Neurological:     General: No focal deficit present.     Mental Status: He is alert and oriented to person, place, and time.     Cranial Nerves: No cranial nerve deficit.     Motor: No weakness.  Psychiatric:        Mood and Affect: Mood normal.        Behavior: Behavior normal.      No results found for any visits on 03/22/24.      Assessment & Plan:  Jesse Nelson was seen today for follow-up.  Primary hypertension -     Olmesartan  Medoxomil-HCTZ; Take 1 tablet by mouth daily.  Dispense: 30 tablet; Refill: 1    Problem List Items Addressed This Visit       Cardiovascular and Mediastinum   Primary  hypertension   Relevant Medications   olmesartan -hydrochlorothiazide  (BENICAR   HCT) 40-12.5 MG tablet    Return in about 2 months (around 05/23/2024) for fu with labs prior.   Total time spent: 20 minutes. This time includes review of previous notes and results and patient face to face interaction during today'Lorenza Shakir visit.    Sherrill Cinderella Perry, MD  03/22/2024   This document may have been prepared by [facility] Voice Recognition software and as such may include unintentional dictation errors.     [1] No Known Allergies [2]  Outpatient Medications Prior to Visit  Medication Sig   albuterol  (VENTOLIN  HFA) 108 (90 Base) MCG/ACT inhaler INHALE 1 PUFF EVERY 6 HOURS AS NEEDED   cholecalciferol (VITAMIN D3) 25 MCG (1000 UNIT) tablet Take 1,000 Units by mouth daily.   clopidogrel  (PLAVIX ) 75 MG tablet Take 75 mg by mouth daily.   cyanocobalamin  (VITAMIN B12) 1000 MCG tablet Take 1,000 mcg by mouth daily.   famotidine  (PEPCID ) 20 MG tablet Take 1 tablet (20 mg total) by mouth daily.   fluticasone  (FLONASE ) 50 MCG/ACT nasal spray Place 2 sprays into both nostrils as needed for allergies.   metoprolol  (TOPROL  XL) 200 MG 24 hr tablet Take 1 tablet (200 mg total) by mouth daily.   montelukast  (SINGULAIR ) 10 MG tablet TAKE 1 TABLET BY MOUTH EVERY MORNING   nitroGLYCERIN  (NITROSTAT ) 0.4 MG SL tablet Place 1 tablet (0.4 mg total) under the tongue every 5 (five) minutes as needed for chest pain.   oxyCODONE -acetaminophen  (PERCOCET) 10-325 MG tablet Take 1 tablet by mouth every 4 (four) hours as needed for pain.   pantoprazole  (PROTONIX ) 40 MG tablet Take 1 tablet (40 mg total) by mouth daily.   rosuvastatin  (CRESTOR ) 10 MG tablet TAKE 1 TABLET(10 MG) BY MOUTH DAILY   [DISCONTINUED] amLODipine  (NORVASC ) 5 MG tablet TAKE 1 TABLET(5 MG) BY MOUTH EVERY MORNING   [DISCONTINUED] olmesartan -hydrochlorothiazide  (BENICAR  HCT) 40-12.5 MG tablet Take 1 tablet by mouth daily.   No facility-administered  medications prior to visit.

## 2024-03-24 ENCOUNTER — Encounter: Payer: Self-pay | Admitting: Cardiovascular Disease

## 2024-03-24 ENCOUNTER — Ambulatory Visit: Admitting: Cardiovascular Disease

## 2024-03-24 VITALS — BP 121/76 | HR 72 | Ht 68.0 in | Wt 141.6 lb

## 2024-03-24 DIAGNOSIS — I10 Essential (primary) hypertension: Secondary | ICD-10-CM | POA: Diagnosis not present

## 2024-03-24 DIAGNOSIS — F17210 Nicotine dependence, cigarettes, uncomplicated: Secondary | ICD-10-CM

## 2024-03-24 DIAGNOSIS — E782 Mixed hyperlipidemia: Secondary | ICD-10-CM

## 2024-03-24 DIAGNOSIS — I340 Nonrheumatic mitral (valve) insufficiency: Secondary | ICD-10-CM

## 2024-03-24 DIAGNOSIS — I4711 Inappropriate sinus tachycardia, so stated: Secondary | ICD-10-CM

## 2024-03-24 DIAGNOSIS — R0602 Shortness of breath: Secondary | ICD-10-CM

## 2024-03-24 DIAGNOSIS — I2510 Atherosclerotic heart disease of native coronary artery without angina pectoris: Secondary | ICD-10-CM | POA: Diagnosis not present

## 2024-03-24 NOTE — Progress Notes (Signed)
 "     Cardiology Office Note   Date:  03/24/2024   ID:  Jesse Nelson, DOB 1970-05-08, MRN [id number]  PCP:  [name], MD  Cardiologist:  [name], MD      History of Present Illness: Jesse Nelson is a 53 y.o. male who presents for  Chief Complaint  Patient presents with   Follow-up    4 month follow up    No complaints.      Past Medical History:  Diagnosis Date   Asthma    Coronary artery disease    Dysrhythmia    GERD (gastroesophageal reflux disease)    Hyperlipidemia    Hypertension      Past Surgical History:  Procedure Laterality Date   COLONOSCOPY N/A 09/09/2023   Procedure: COLONOSCOPY;  Surgeon: Onita Elspeth Sharper, DO;  Location: [facility] ENDOSCOPY;  Service: Gastroenterology;  Laterality: N/A;  Patient is on Plavix    ESOPHAGOGASTRODUODENOSCOPY N/A 09/09/2023   Procedure: EGD (ESOPHAGOGASTRODUODENOSCOPY);  Surgeon: Onita Elspeth Sharper, DO;  Location: [facility] ENDOSCOPY;  Service: Gastroenterology;  Laterality: N/A;   shoulder spur removed Left      Current Outpatient Medications  Medication Sig Dispense Refill   albuterol  (VENTOLIN  HFA) 108 (90 Base) MCG/ACT inhaler INHALE 1 PUFF EVERY 6 HOURS AS NEEDED 18 g 5   cholecalciferol (VITAMIN D3) 25 MCG (1000 UNIT) tablet Take 1,000 Units by mouth daily.     clopidogrel  (PLAVIX ) 75 MG tablet Take 75 mg by mouth daily.     cyanocobalamin  (VITAMIN B12) 1000 MCG tablet Take 1,000 mcg by mouth daily.     famotidine  (PEPCID ) 20 MG tablet Take 1 tablet (20 mg total) by mouth daily.     fluticasone  (FLONASE ) 50 MCG/ACT nasal spray Place 2 sprays into both nostrils as needed for allergies. 16 mL 2   metoprolol  (TOPROL  XL) 200 MG 24 hr tablet Take 1 tablet (200 mg total) by mouth daily. 90 tablet 3   montelukast  (SINGULAIR ) 10 MG tablet TAKE 1 TABLET BY MOUTH EVERY MORNING 90 tablet 1   nitroGLYCERIN  (NITROSTAT ) 0.4 MG SL tablet Place 1 tablet (0.4 mg total) under the tongue every 5 (five) minutes as  needed for chest pain. 100 tablet 3   olmesartan -hydrochlorothiazide  (BENICAR  HCT) 40-12.5 MG tablet Take 1 tablet by mouth daily. 30 tablet 1   oxyCODONE -acetaminophen  (PERCOCET) 10-325 MG tablet Take 1 tablet by mouth every 4 (four) hours as needed for pain.     pantoprazole  (PROTONIX ) 40 MG tablet Take 1 tablet (40 mg total) by mouth daily. 30 tablet 11   rosuvastatin  (CRESTOR ) 10 MG tablet TAKE 1 TABLET(10 MG) BY MOUTH DAILY 90 tablet 1   No current facility-administered medications for this visit.    Allergies:   Patient has no known allergies.    Social History:   reports that he has been smoking cigarettes. He started smoking about 36 years ago. He has a 46.2 pack-year smoking history. He has never used smokeless tobacco. He reports that he does not currently use alcohol. He reports that he does not currently use drugs.   Family History:  family history is not on file.    ROS:     Review of Systems  Constitutional: Negative.   HENT: Negative.    Eyes: Negative.   Respiratory: Negative.    Gastrointestinal: Negative.   Genitourinary: Negative.   Musculoskeletal: Negative.   Skin: Negative.   Neurological: Negative.   Endo/Heme/Allergies: Negative.   Psychiatric/Behavioral: Negative.  All other systems reviewed and are negative.     All other systems are reviewed and negative.    PHYSICAL EXAM: VS:  BP 121/76   Pulse 72   Ht 5' 8 (1.727 m)   Wt 141 lb 9.6 oz (64.2 kg)   SpO2 96%   BMI 21.53 kg/m  , BMI Body mass index is 21.53 kg/m. Last weight:  Wt Readings from Last 3 Encounters:  03/24/24 141 lb 9.6 oz (64.2 kg)  03/22/24 141 lb 9.6 oz (64.2 kg)  03/14/24 141 lb 14.4 oz (64.4 kg)     Physical Exam Vitals reviewed.  Constitutional:      Appearance: Normal appearance. He is normal weight.  HENT:     Head: Normocephalic.     Nose: Nose normal.     Mouth/Throat:     Mouth: Mucous membranes are moist.  Eyes:     Pupils: Pupils are equal, round,  and reactive to light.  Cardiovascular:     Rate and Rhythm: Normal rate and regular rhythm.     Pulses: Normal pulses.     Heart sounds: Normal heart sounds.  Pulmonary:     Effort: Pulmonary effort is normal.  Abdominal:     General: Abdomen is flat. Bowel sounds are normal.  Musculoskeletal:        General: Normal range of motion.     Cervical back: Normal range of motion.  Skin:    General: Skin is warm.  Neurological:     General: No focal deficit present.     Mental Status: He is alert.  Psychiatric:        Mood and Affect: Mood normal.       EKG:   Recent Labs: 12/28/2023: Magnesium  1.7 12/30/2023: BUN 64; Creatinine, Ser 2.00; Hemoglobin 11.6; Platelets 336; Potassium 4.2; Sodium 141 02/21/2024: ALT 6    Lipid Panel    Component Value Date/Time   CHOL 118 02/21/2024 1002   TRIG 129 02/21/2024 1002   HDL 34 (L) 02/21/2024 1002   CHOLHDL 3.5 02/21/2024 1002   LDLCALC 61 02/21/2024 1002      Other studies Reviewed: Additional studies/ records that were reviewed today include:  Review of the above records demonstrates:       No data to display            ASSESSMENT AND PLAN:    ICD-10-CM   1. SOB (shortness of breath)  R06.02    FEELING BETTER.    2. Primary hypertension  I10     3. Mixed hyperlipidemia  E78.2     4. 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 Nonrheumatic mitral valve regurgitation  I34.0     6. Coronary artery disease involving native coronary artery of native heart without angina pectoris  I25.10        Problem List Items Addressed This Visit       Cardiovascular and Mediastinum   Primary hypertension   Coronary artery disease involving native coronary artery of native heart without angina pectoris     Other   Mixed hyperlipidemia   Other Visit Diagnoses       SOB (shortness of breath)    -  Primary   FEELING BETTER.     Inappropriate sinus tachycardia         Nonrheumatic mitral valve regurgitation               Disposition:   Return in about 3 months (around  06/22/2024).    Total time spent: 35 minutes  Signed,  [name], MD  03/24/2024 10:50 AM    [facility] "

## 2024-04-01 ENCOUNTER — Other Ambulatory Visit: Payer: Self-pay | Admitting: Cardiovascular Disease

## 2024-04-01 ENCOUNTER — Other Ambulatory Visit: Payer: Self-pay | Admitting: Internal Medicine

## 2024-06-20 ENCOUNTER — Ambulatory Visit: Admitting: Internal Medicine

## 2024-06-23 ENCOUNTER — Ambulatory Visit: Admitting: Cardiovascular Disease
